# Patient Record
Sex: Female | Born: 2000 | Race: Black or African American | Hispanic: Yes | Marital: Single | Smoking: Never smoker
Health system: Southern US, Community
[De-identification: ages and names within clinical notes are randomized; demographics above are authoritative.]

## PROBLEM LIST (undated history)

## (undated) DIAGNOSIS — D649 Anemia, unspecified: Secondary | ICD-10-CM

## (undated) HISTORY — PX: NO PAST SURGERIES: SHX2092

## (undated) HISTORY — DX: Anemia, unspecified: D64.9

---

## 2019-01-06 NOTE — L&D Delivery Note (Signed)
Delivery Note At 3:20 AM a viable female was delivered via Vaginal, Spontaneous (Presentation:   Occiput Anterior).  Tight nuchal and body cord noted on delivery, delivered through. APGAR:8,9; weight 2750g. Placenta status: Spontaneous, Intact.  Cord: 3 vessels with the following complications: None.  Anesthesia: Epidural Episiotomy: None Lacerations: 1st degree, repaired Suture Repair: 3.0 vicryl Est. Blood Loss (mL): 300  Mom to postpartum.  Baby to Couplet care / Skin to Skin.  Alric Seton 10/14/2019, 4:13 AM

## 2019-02-26 ENCOUNTER — Emergency Department (HOSPITAL_COMMUNITY)

## 2019-02-26 ENCOUNTER — Other Ambulatory Visit: Payer: Self-pay

## 2019-02-26 ENCOUNTER — Emergency Department (HOSPITAL_COMMUNITY)
Admission: EM | Admit: 2019-02-26 | Discharge: 2019-02-26 | Disposition: A | Discharge status: Home / Self Care | Attending: Emergency Medicine | Admitting: Emergency Medicine

## 2019-02-26 ENCOUNTER — Encounter (HOSPITAL_COMMUNITY): Payer: Self-pay

## 2019-02-26 DIAGNOSIS — B373 Candidiasis of vulva and vagina: Secondary | ICD-10-CM | POA: Insufficient documentation

## 2019-02-26 DIAGNOSIS — B9689 Other specified bacterial agents as the cause of diseases classified elsewhere: Secondary | ICD-10-CM

## 2019-02-26 DIAGNOSIS — N760 Acute vaginitis: Secondary | ICD-10-CM | POA: Diagnosis not present

## 2019-02-26 DIAGNOSIS — O23591 Infection of other part of genital tract in pregnancy, first trimester: Secondary | ICD-10-CM | POA: Insufficient documentation

## 2019-02-26 DIAGNOSIS — Z3A01 Less than 8 weeks gestation of pregnancy: Secondary | ICD-10-CM | POA: Insufficient documentation

## 2019-02-26 DIAGNOSIS — O99891 Other specified diseases and conditions complicating pregnancy: Secondary | ICD-10-CM | POA: Diagnosis present

## 2019-02-26 DIAGNOSIS — Z3490 Encounter for supervision of normal pregnancy, unspecified, unspecified trimester: Secondary | ICD-10-CM

## 2019-02-26 DIAGNOSIS — B3731 Acute candidiasis of vulva and vagina: Secondary | ICD-10-CM

## 2019-02-26 LAB — COMPREHENSIVE METABOLIC PANEL
ALT: 16 U/L (ref 0–44)
AST: 23 U/L (ref 15–41)
Albumin: 4.5 g/dL (ref 3.5–5.0)
Alkaline Phosphatase: 50 U/L (ref 38–126)
Anion gap: 13 (ref 5–15)
BUN: 12 mg/dL (ref 6–20)
CO2: 18 mmol/L — ABNORMAL LOW (ref 22–32)
Calcium: 9.6 mg/dL (ref 8.9–10.3)
Chloride: 104 mmol/L (ref 98–111)
Creatinine, Ser: 0.75 mg/dL (ref 0.44–1.00)
GFR calc Af Amer: >60 mL/min (ref >60)
GFR calc non Af Amer: >60 mL/min (ref >60)
Glucose, Bld: 86 mg/dL (ref 70–99)
Potassium: 3.8 mmol/L (ref 3.5–5.1)
Sodium: 135 mmol/L (ref 135–145)
Total Bilirubin: 0.7 mg/dL (ref 0.3–1.2)
Total Protein: 8.1 g/dL (ref 6.5–8.1)

## 2019-02-26 LAB — URINALYSIS, ROUTINE W REFLEX MICROSCOPIC
Bacteria, UA: NONE SEEN
Bilirubin Urine: NEGATIVE
Glucose, UA: NEGATIVE mg/dL
Hgb urine dipstick: NEGATIVE
Ketones, ur: 80 mg/dL — AB
Leukocytes,Ua: NEGATIVE
Nitrite: NEGATIVE
Protein, ur: 30 mg/dL — AB
Specific Gravity, Urine: 1.029 (ref 1.005–1.030)
pH: 6 (ref 5.0–8.0)

## 2019-02-26 LAB — I-STAT BETA HCG BLOOD, ED (MC, WL, AP ONLY): I-stat hCG, quantitative: >2000 m[IU]/mL — ABNORMAL HIGH (ref <5)

## 2019-02-26 LAB — CBC
HCT: 36.5 % (ref 36.0–46.0)
Hemoglobin: 11.5 g/dL — ABNORMAL LOW (ref 12.0–15.0)
MCH: 24 pg — ABNORMAL LOW (ref 26.0–34.0)
MCHC: 31.5 g/dL (ref 30.0–36.0)
MCV: 76 fL — ABNORMAL LOW (ref 80.0–100.0)
Platelets: 383 10*3/uL (ref 150–400)
RBC: 4.8 MIL/uL (ref 3.87–5.11)
RDW: 16.3 % — ABNORMAL HIGH (ref 11.5–15.5)
WBC: 9.4 10*3/uL (ref 4.0–10.5)
nRBC: 0 % (ref 0.0–0.2)

## 2019-02-26 LAB — WET PREP, GENITAL
Sperm: NONE SEEN
Trich, Wet Prep: NONE SEEN

## 2019-02-26 LAB — HCG, QUANTITATIVE, PREGNANCY: hCG, Beta Chain, Quant, S: 60252 m[IU]/mL — ABNORMAL HIGH (ref <5)

## 2019-02-26 LAB — ABO/RH: ABO/RH(D): A POS

## 2019-02-26 LAB — LIPASE, BLOOD: Lipase: 17 U/L (ref 11–51)

## 2019-02-26 MED ORDER — METRONIDAZOLE 500 MG PO TABS: 500.0000 mg | ORAL_TABLET | Freq: Two times a day (BID) | ORAL | 0 refills | Status: AC

## 2019-02-26 MED ORDER — SODIUM CHLORIDE 0.9% FLUSH: 3.0000 mL | Freq: Once | INTRAVENOUS | Status: DC

## 2019-02-26 MED ORDER — CLOTRIMAZOLE 1 % VA CREA: 1.0000 | TOPICAL_CREAM | Freq: Every day | VAGINAL | 0 refills | Status: AC

## 2019-02-26 MED ORDER — SODIUM CHLORIDE 0.9 % IV BOLUS
1000.0000 mL | Freq: Once | INTRAVENOUS | Status: AC
  Administered 2019-02-26: 1000 mL via INTRAVENOUS

## 2019-02-26 NOTE — ED Triage Notes (Signed)
Patient c/o abdominal pain, N/V x 1 month and states she had diarrhea 2 weeks ago. Patient states she "has pain in different places of her abdomen depending on the day."

## 2019-02-26 NOTE — ED Provider Notes (Signed)
[facility]-EMERGENCY DEPT [facility] Emergency Department Provider Note MRN:  [id number]  Arrival date & time: 02/26/19     Chief Complaint   Abdominal Pain and Emesis   History of Present Illness   Cassandra Bradley is a 19 y.o. year-old female with no pertinent past medical history presenting to the ED with chief complaint of abdominal pain and emesis.  Intermittent abdominal pain for the past month, usually in the lower abdomen.  Became much worse this morning, trouble standing straight up due to the pain.  Trouble with bowel movements today, explaining that she felt too weak and sick to wipe herself.  Several episodes of vomiting today as well with nausea.  Denies chest pain or shortness of breath, no upper abdominal pain, no vaginal bleeding or discharge.  Review of Systems  A complete 10 system review of systems was obtained and all systems are negative except as noted in the HPI and PMH.   Patient's Health History   History reviewed. No pertinent past medical history.  History reviewed. No pertinent surgical history.  Family History  Problem Relation Age of Onset  . Healthy Mother   . Healthy Father     Social History   Socioeconomic History  . Marital status: Single    Spouse name: Not on file  . Number of children: Not on file  . Years of education: Not on file  . Highest education level: Not on file  Occupational History  . Not on file  Tobacco Use  . Smoking status: Never Smoker  . Smokeless tobacco: Never Used  Substance and Sexual Activity  . Alcohol use: Never  . Drug use: Never  . Sexual activity: Not on file  Other Topics Concern  . Not on file  Social History Narrative  . Not on file   Social Determinants of Health   Financial Resource Strain:   . Difficulty of Paying Living Expenses: Not on file  Food Insecurity:   . Worried About Programme researcher, broadcasting/film/video in the Last Year: Not on file  . Ran Out of Food in the Last Year: Not on file  Transportation Needs:    . Lack of Transportation (Medical): Not on file  . Lack of Transportation (Non-Medical): Not on file  Physical Activity:   . Days of Exercise per Week: Not on file  . Minutes of Exercise per Session: Not on file  Stress:   . Feeling of Stress : Not on file  Social Connections:   . Frequency of Communication with Friends and Family: Not on file  . Frequency of Social Gatherings with Friends and Family: Not on file  . Attends Religious Services: Not on file  . Active Member of Clubs or Organizations: Not on file  . Attends Banker Meetings: Not on file  . Marital Status: Not on file  Intimate Partner Violence:   . Fear of Current or Ex-Partner: Not on file  . Emotionally Abused: Not on file  . Physically Abused: Not on file  . Sexually Abused: Not on file     Physical Exam   Vitals:   02/26/19 1900 02/26/19 1901  BP: 108/63 108/63  Pulse: (!) 114 (!) 115  Resp: 18 18  Temp:    SpO2: 100% 100%    CONSTITUTIONAL: Well-appearing, NAD NEURO:  Alert and oriented x 3, no focal deficits EYES:  eyes equal and reactive ENT/NECK:  no LAD, no JVD CARDIO: Tachycardic rate, well-perfused, normal S1 and S2 PULM:  CTAB  no wheezing or rhonchi GI/GU:  normal bowel sounds, non-distended, mild right and left lower abdominal tenderness MSK/SPINE:  No gross deformities, no edema SKIN:  no rash, atraumatic PSYCH:  Appropriate speech and behavior  *Additional and/or pertinent findings included in MDM below  Diagnostic and Interventional Summary    EKG Interpretation  Date/Time:    Ventricular Rate:    PR Interval:    QRS Duration:   QT Interval:    QTC Calculation:   R Axis:     Text Interpretation:        Cardiac Monitoring Interpretation:  Labs Reviewed  WET PREP, GENITAL - Abnormal; Notable for the following components:      Result Value   Yeast Wet Prep HPF POC PRESENT (*)    Clue Cells Wet Prep HPF POC PRESENT (*)    WBC, Wet Prep HPF POC MANY (*)    All  other components within normal limits  COMPREHENSIVE METABOLIC PANEL - Abnormal; Notable for the following components:   CO2 18 (*)    All other components within normal limits  CBC - Abnormal; Notable for the following components:   Hemoglobin 11.5 (*)    MCV 76.0 (*)    MCH 24.0 (*)    RDW 16.3 (*)    All other components within normal limits  URINALYSIS, ROUTINE W REFLEX MICROSCOPIC - Abnormal; Notable for the following components:   Ketones, ur 80 (*)    Protein, ur 30 (*)    All other components within normal limits  HCG, QUANTITATIVE, PREGNANCY - Abnormal; Notable for the following components:   hCG, Beta Chain, Quant, S 60,252 (*)    All other components within normal limits  I-STAT BETA HCG BLOOD, ED ([facility], [facility], [facility] ONLY) - Abnormal; Notable for the following components:   I-stat hCG, quantitative >2,000.0 (*)    All other components within normal limits  LIPASE, BLOOD  ABO/RH  GC/CHLAMYDIA PROBE AMP ([place]) NOT AT [facility]    US OB Comp < 14 Wks  Final Result    US OB Transvaginal  Final Result      Medications  sodium chloride flush (NS) 0.9 % injection 3 mL (0 mLs Intravenous Hold 02/26/19 1909)  sodium chloride 0.9 % bolus 1,000 mL (0 mLs Intravenous Stopped 02/26/19 1908)     Procedures  /  Critical Care Procedures  ED Course and Medical Decision Making  I have reviewed the triage vital signs, the nursing notes, and pertinent available records from the EMR.  Pertinent labs & imaging results that were available during my care of the patient were reviewed by me and considered in my medical decision making (see below for details).     Question of appendicitis versus genitourinary etiology such as UTI or PID, also considering IBS, IBD, given the swift change in severity today will obtain CT imaging to exclude surgical process.  4 PM update: Patient's i-STAT hCG has come back positive, greater than 2000.  This news discussed with patient, unplanned pregnancy.  CT  abdomen canceled, will obtain ultrasound imaging to confirm intrauterine pregnancy and rule out ectopic.  Pelvic exam is largely unremarkable, normal-appearing external genitalia, no significant adnexal tenderness, normal appearing cervix.  Moderate amount of white mucus in the vaginal vault, to be sent to lab for testing.  7:26 PM update: Testing reveals intrauterine pregnancy, estimated 6 weeks and 3 days.  Swabs reveal candidal yeast infection and BV.  I suspect that the pregnancy and these infections are the  etiology of patient's pain.  Given these findings and her continued reassuring abdominal exam I have low concern for surgical process.  Appropriate for discharge with appropriate antimicrobials and OB follow-up.  Elmer Sow. Pilar Plate, MD [facility] Emergency Medicine [facility] [email or web address]  Final Clinical Impressions(s) / ED Diagnoses     ICD-10-CM   1. Pregnancy at early stage  Z34.90   2. Yeast infection of the vagina  B37.3   3. Bacterial vaginosis  N76.0    B96.89     ED Discharge Orders         Ordered    clotrimazole (GYNE-LOTRIMIN) 1 % vaginal cream  Daily at bedtime     02/26/19 1925    metroNIDAZOLE (FLAGYL) 500 MG tablet  2 times daily     02/26/19 1925           Discharge Instructions Discussed with and Provided to Patient:     Discharge Instructions     You were evaluated in the Emergency Department and after careful evaluation, we did not find any emergent condition requiring admission or further testing in the hospital.  Your exam/testing today was overall reassuring.  We have discovered that you are pregnant, estimated 6 weeks and 3 days along.  Your testing was positive for a yeast infection as well as bacterial vaginosis.  Please take the medications provided as directed and follow-up with the women's center for further management options.  Please return to the Emergency Department if you experience any worsening of your condition.   We encourage you to follow up with a primary care provider.  Thank you for allowing [place] to be a part of your care.        Sabas Sous, MD 02/26/19 Kristopher Oppenheim

## 2019-02-26 NOTE — Discharge Instructions (Addendum)
You were evaluated in the Emergency Department and after careful evaluation, we did not find any emergent condition requiring admission or further testing in the hospital.  Your exam/testing today was overall reassuring.  We have discovered that you are pregnant, estimated 6 weeks and 3 days along.  Your testing was positive for a yeast infection as well as bacterial vaginosis.  Please take the medications provided as directed and follow-up with the women's center for further management options.  Please return to the Emergency Department if you experience any worsening of your condition.  We encourage you to follow up with a primary care provider.  Thank you for allowing [place] to be a part of your care.

## 2019-02-28 LAB — GC/CHLAMYDIA PROBE AMP (~~LOC~~) NOT AT ARMC
Chlamydia: NEGATIVE
Neisseria Gonorrhea: NEGATIVE

## 2019-03-29 ENCOUNTER — Ambulatory Visit: Admitting: *Deleted

## 2019-03-29 DIAGNOSIS — Z3401 Encounter for supervision of normal first pregnancy, first trimester: Secondary | ICD-10-CM | POA: Insufficient documentation

## 2019-03-29 DIAGNOSIS — O219 Vomiting of pregnancy, unspecified: Secondary | ICD-10-CM

## 2019-03-29 MED ORDER — PROMETHAZINE HCL 25 MG PO TABS: 25.0000 mg | ORAL_TABLET | Freq: Four times a day (QID) | ORAL | 0 refills | Status: DC | PRN

## 2019-03-29 MED ORDER — BLOOD PRESSURE MONITOR KIT: 1.0000 | PACK | Freq: Once | 0 refills | Status: AC

## 2019-03-29 NOTE — Progress Notes (Signed)
Patient ID: Cassandra Bradley, female   DOB: June 14, 2000, 19 y.o.   MRN: [id number] Patient seen and assessed by nursing staff during this encounter. I have reviewed the chart and agree with the documentation and plan. I have also made any necessary editorial changes.  Scheryl Darter, MD 03/29/2019 3:32 PM

## 2019-03-29 NOTE — Progress Notes (Signed)
PRENATAL INTAKE SUMMARY  Ms. Klus presents today New OB Nurse Interview.  OB History    Gravida  1   Para      Term      Preterm      AB      Living        SAB      TAB      Ectopic      Multiple      Live Births             I have reviewed the patient's medical, obstetrical, social, and family histories, medications, and available lab results.  SUBJECTIVE She complaints of N&V  OBJECTIVE New OB Intake (New OB)  GENERAL APPEARANCE: sounds alert via televisit  ASSESSMENT Normal pregnancy  PLAN Prenatal care at [facility] ordered today Phenergan sent today BP cuff ordered

## 2019-04-06 ENCOUNTER — Ambulatory Visit (INDEPENDENT_AMBULATORY_CARE_PROVIDER_SITE_OTHER): Admitting: Obstetrics and Gynecology

## 2019-04-06 ENCOUNTER — Encounter: Payer: Self-pay | Admitting: Obstetrics and Gynecology

## 2019-04-06 ENCOUNTER — Other Ambulatory Visit: Payer: Self-pay

## 2019-04-06 ENCOUNTER — Other Ambulatory Visit (HOSPITAL_COMMUNITY)
Admission: RE | Admit: 2019-04-06 | Discharge: 2019-04-06 | Disposition: A | Discharge status: Home / Self Care | Source: Ambulatory Visit | Attending: Obstetrics and Gynecology | Admitting: Obstetrics and Gynecology

## 2019-04-06 VITALS — BP 113/80 | HR 114 | Wt 110.6 lb

## 2019-04-06 DIAGNOSIS — Z3401 Encounter for supervision of normal first pregnancy, first trimester: Secondary | ICD-10-CM | POA: Insufficient documentation

## 2019-04-06 MED ORDER — VITAFOL GUMMIES 3.33-0.333-34.8 MG PO CHEW: 2.0000 | CHEWABLE_TABLET | Freq: Every day | ORAL | 6 refills | Status: AC

## 2019-04-06 MED ORDER — PRENATAL GUMMIES/DHA & FA 0.4-32.5 MG PO CHEW: 3.0000 | CHEWABLE_TABLET | Freq: Every day | ORAL | 6 refills | Status: AC

## 2019-04-06 MED ORDER — DOXYLAMINE-PYRIDOXINE 10-10 MG PO TBEC: 2.0000 | DELAYED_RELEASE_TABLET | Freq: Every day | ORAL | 5 refills | Status: DC

## 2019-04-06 NOTE — Progress Notes (Signed)
  Subjective:    Cassandra Bradley is a G1P0 [redacted]w[redacted]d being seen today for her first obstetrical visit.  Her obstetrical history is significant for first pregnancy. Patient does intend to breast feed. Pregnancy history fully reviewed.  Patient reports nausea and vomiting.  Vitals:   04/06/19 0910  BP: 113/80  Pulse: (!) 114  Weight: 110 lb 9.6 oz (50.2 kg)    HISTORY: OB History  Gravida Para Term Preterm AB Living  1            SAB TAB Ectopic Multiple Live Births               # Outcome Date GA Lbr Len/2nd Weight Sex Delivery Anes PTL Lv  1 Current            History reviewed. No pertinent past medical history. History reviewed. No pertinent surgical history. Family History  Problem Relation Age of Onset  . Healthy Mother   . Healthy Father   . Hypertension Paternal Grandmother   . Hypertension Paternal Grandfather   . Lupus Paternal Grandfather   . Sickle cell trait Paternal Grandfather   . Diabetes Maternal Grandfather      Exam    Uterus:     Pelvic Exam:    Perineum: Normal Perineum   Vulva: normal   Vagina:  normal mucosa, normal discharge   pH:    Cervix: nulliparous appearance and cervix is closed and long   Adnexa: no mass, fullness, tenderness   Bony Pelvis: gynecoid  System: Breast:  normal appearance, no masses or tenderness   Skin: normal coloration and turgor, no rashes    Neurologic: oriented, no focal deficits   Extremities: normal strength, tone, and muscle mass   HEENT extra ocular movement intact   Mouth/Teeth mucous membranes moist, pharynx normal without lesions and dental hygiene good   Neck supple and no masses   Cardiovascular: regular rate and rhythm   Respiratory:  appears well, vitals normal, no respiratory distress, acyanotic, normal RR, chest clear, no wheezing, crepitations, rhonchi, normal symmetric air entry   Abdomen: soft, non-tender; bowel sounds normal; no masses,  no organomegaly   Urinary:       Assessment:    Pregnancy:  G1P0 Patient Active Problem List   Diagnosis Date Noted  . Encounter for supervision of normal first pregnancy in first trimester 03/29/2019        Plan:     Initial labs drawn. Prenatal vitamins. Problem list reviewed and updated. Genetic Screening discussed : panorama ordered.  Ultrasound discussed; fetal survey: ordered. Rx diclegis provided  Follow up in 4 weeks. 50% of 30 min visit spent on counseling and coordination of care.     Mairin Lindsley 04/06/2019

## 2019-04-06 NOTE — Progress Notes (Signed)
NOB in office, 1st pregnancy, complains of some back pain. Pt desires genetic screening, advised of additional costs, pt agreed. Pt states that she picked up BP cuff.

## 2019-04-06 NOTE — Patient Instructions (Signed)
 First Trimester of Pregnancy The first trimester of pregnancy is from week 1 until the end of week 13 (months 1 through 3). A week after a sperm fertilizes an egg, the egg will implant on the wall of the uterus. This embryo will begin to develop into a baby. Genes from you and your partner will form the baby. The female genes will determine whether the baby will be a boy or a girl. At 6-8 weeks, the eyes and face will be formed, and the heartbeat can be seen on ultrasound. At the end of 12 weeks, all the baby's organs will be formed. Now that you are pregnant, you will want to do everything you can to have a healthy baby. Two of the most important things are to get good prenatal care and to follow your health care provider's instructions. Prenatal care is all the medical care you receive before the baby's birth. This care will help prevent, find, and treat any problems during the pregnancy and childbirth. Body changes during your first trimester Your body goes through many changes during pregnancy. The changes vary from woman to woman.  You may gain or lose a couple of pounds at first.  You may feel sick to your stomach (nauseous) and you may throw up (vomit). If the vomiting is uncontrollable, call your health care provider.  You may tire easily.  You may develop headaches that can be relieved by medicines. All medicines should be approved by your health care provider.  You may urinate more often. Painful urination may mean you have a bladder infection.  You may develop heartburn as a result of your pregnancy.  You may develop constipation because certain hormones are causing the muscles that push stool through your intestines to slow down.  You may develop hemorrhoids or swollen veins (varicose veins).  Your breasts may begin to grow larger and become tender. Your nipples may stick out more, and the tissue that surrounds them (areola) may become darker.  Your gums may bleed and may be  sensitive to brushing and flossing.  Dark spots or blotches (chloasma, mask of pregnancy) may develop on your face. This will likely fade after the baby is born.  Your menstrual periods will stop.  You may have a loss of appetite.  You may develop cravings for certain kinds of food.  You may have changes in your emotions from day to day, such as being excited to be pregnant or being concerned that something may go wrong with the pregnancy and baby.  You may have more vivid and strange dreams.  You may have changes in your hair. These can include thickening of your hair, rapid growth, and changes in texture. Some women also have hair loss during or after pregnancy, or hair that feels dry or thin. Your hair will most likely return to normal after your baby is born. What to expect at prenatal visits During a routine prenatal visit:  You will be weighed to make sure you and the baby are growing normally.  Your blood pressure will be taken.  Your abdomen will be measured to track your baby's growth.  The fetal heartbeat will be listened to between weeks 10 and 14 of your pregnancy.  Test results from any previous visits will be discussed. Your health care provider may ask you:  How you are feeling.  If you are feeling the baby move.  If you have had any abnormal symptoms, such as leaking fluid, bleeding, severe headaches, or   abdominal cramping.  If you are using any tobacco products, including cigarettes, chewing tobacco, and electronic cigarettes.  If you have any questions. Other tests that may be performed during your first trimester include:  Blood tests to find your blood type and to check for the presence of any previous infections. The tests will also be used to check for low iron levels (anemia) and protein on red blood cells (Rh antibodies). Depending on your risk factors, or if you previously had diabetes during pregnancy, you may have tests to check for high blood sugar  that affects pregnant women (gestational diabetes).  Urine tests to check for infections, diabetes, or protein in the urine.  An ultrasound to confirm the proper growth and development of the baby.  Fetal screens for spinal cord problems (spina bifida) and Down syndrome.  HIV (human immunodeficiency virus) testing. Routine prenatal testing includes screening for HIV, unless you choose not to have this test.  You may need other tests to make sure you and the baby are doing well. Follow these instructions at home: Medicines  Follow your health care provider's instructions regarding medicine use. Specific medicines may be either safe or unsafe to take during pregnancy.  Take a prenatal vitamin that contains at least 600 micrograms (mcg) of folic acid.  If you develop constipation, try taking a stool softener if your health care provider approves. Eating and drinking   Eat a balanced diet that includes fresh fruits and vegetables, whole grains, good sources of protein such as meat, eggs, or tofu, and low-fat dairy. Your health care provider will help you determine the amount of weight gain that is right for you.  Avoid raw meat and uncooked cheese. These carry germs that can cause birth defects in the baby.  Eating four or five small meals rather than three large meals a day may help relieve nausea and vomiting. If you start to feel nauseous, eating a few soda crackers can be helpful. Drinking liquids between meals, instead of during meals, also seems to help ease nausea and vomiting.  Limit foods that are high in fat and processed sugars, such as fried and sweet foods.  To prevent constipation: ? Eat foods that are high in fiber, such as fresh fruits and vegetables, whole grains, and beans. ? Drink enough fluid to keep your urine clear or pale yellow. Activity  Exercise only as directed by your health care provider. Most women can continue their usual exercise routine during  pregnancy. Try to exercise for 30 minutes at least 5 days a week. Exercising will help you: ? Control your weight. ? Stay in shape. ? Be prepared for labor and delivery.  Experiencing pain or cramping in the lower abdomen or lower back is a good sign that you should stop exercising. Check with your health care provider before continuing with normal exercises.  Try to avoid standing for long periods of time. Move your legs often if you must stand in one place for a long time.  Avoid heavy lifting.  Wear low-heeled shoes and practice good posture.  You may continue to have sex unless your health care provider tells you not to. Relieving pain and discomfort  Wear a good support bra to relieve breast tenderness.  Take warm sitz baths to soothe any pain or discomfort caused by hemorrhoids. Use hemorrhoid cream if your health care provider approves.  Rest with your legs elevated if you have leg cramps or low back pain.  If you develop varicose veins   in your legs, wear support hose. Elevate your feet for 15 minutes, 3-4 times a day. Limit salt in your diet. Prenatal care  Schedule your prenatal visits by the twelfth week of pregnancy. They are usually scheduled monthly at first, then more often in the last 2 months before delivery.  Write down your questions. Take them to your prenatal visits.  Keep all your prenatal visits as told by your health care provider. This is important. Safety  Wear your seat belt at all times when driving.  Make a list of emergency phone numbers, including numbers for family, friends, the hospital, and police and fire departments. General instructions  Ask your health care provider for a referral to a local prenatal education class. Begin classes no later than the beginning of month 6 of your pregnancy.  Ask for help if you have counseling or nutritional needs during pregnancy. Your health care provider can offer advice or refer you to specialists for help  with various needs.  Do not use hot tubs, steam rooms, or saunas.  Do not douche or use tampons or scented sanitary pads.  Do not cross your legs for long periods of time.  Avoid cat litter boxes and soil used by cats. These carry germs that can cause birth defects in the baby and possibly loss of the fetus by miscarriage or stillbirth.  Avoid all smoking, herbs, alcohol, and medicines not prescribed by your health care provider. Chemicals in these products affect the formation and growth of the baby.  Do not use any products that contain nicotine or tobacco, such as cigarettes and e-cigarettes. If you need help quitting, ask your health care provider. You may receive counseling support and other resources to help you quit.  Schedule a dentist appointment. At home, brush your teeth with a soft toothbrush and be gentle when you floss. Contact a health care provider if:  You have dizziness.  You have mild pelvic cramps, pelvic pressure, or nagging pain in the abdominal area.  You have persistent nausea, vomiting, or diarrhea.  You have a bad smelling vaginal discharge.  You have pain when you urinate.  You notice increased swelling in your face, hands, legs, or ankles.  You are exposed to fifth disease or chickenpox.  You are exposed to German measles (rubella) and have never had it. Get help right away if:  You have a fever.  You are leaking fluid from your vagina.  You have spotting or bleeding from your vagina.  You have severe abdominal cramping or pain.  You have rapid weight gain or loss.  You vomit blood or material that looks like coffee grounds.  You develop a severe headache.  You have shortness of breath.  You have any kind of trauma, such as from a fall or a car accident. Summary  The first trimester of pregnancy is from week 1 until the end of week 13 (months 1 through 3).  Your body goes through many changes during pregnancy. The changes vary from  woman to woman.  You will have routine prenatal visits. During those visits, your health care provider will examine you, discuss any test results you may have, and talk with you about how you are feeling. This information is not intended to replace advice given to you by your health care provider. Make sure you discuss any questions you have with your health care provider. Document Revised: 12/04/2016 Document Reviewed: 12/04/2015 [employer] Patient Education  2020 [employer]   Second Trimester of   Pregnancy The second trimester is from week 14 through week 27 (months 4 through 6). The second trimester is often a time when you feel your best. Your body has adjusted to being pregnant, and you begin to feel better physically. Usually, morning sickness has lessened or quit completely, you may have more energy, and you may have an increase in appetite. The second trimester is also a time when the fetus is growing rapidly. At the end of the sixth month, the fetus is about 9 inches long and weighs about 1 pounds. You will likely begin to feel the baby move (quickening) between 16 and 20 weeks of pregnancy. Body changes during your second trimester Your body continues to go through many changes during your second trimester. The changes vary from woman to woman.  Your weight will continue to increase. You will notice your lower abdomen bulging out.  You may begin to get stretch marks on your hips, abdomen, and breasts.  You may develop headaches that can be relieved by medicines. The medicines should be approved by your health care provider.  You may urinate more often because the fetus is pressing on your bladder.  You may develop or continue to have heartburn as a result of your pregnancy.  You may develop constipation because certain hormones are causing the muscles that push waste through your intestines to slow down.  You may develop hemorrhoids or swollen, bulging veins (varicose  veins).  You may have back pain. This is caused by: ? Weight gain. ? Pregnancy hormones that are relaxing the joints in your pelvis. ? A shift in weight and the muscles that support your balance.  Your breasts will continue to grow and they will continue to become tender.  Your gums may bleed and may be sensitive to brushing and flossing.  Dark spots or blotches (chloasma, mask of pregnancy) may develop on your face. This will likely fade after the baby is born.  A dark line from your belly button to the pubic area (linea nigra) may appear. This will likely fade after the baby is born.  You may have changes in your hair. These can include thickening of your hair, rapid growth, and changes in texture. Some women also have hair loss during or after pregnancy, or hair that feels dry or thin. Your hair will most likely return to normal after your baby is born. What to expect at prenatal visits During a routine prenatal visit:  You will be weighed to make sure you and the fetus are growing normally.  Your blood pressure will be taken.  Your abdomen will be measured to track your baby's growth.  The fetal heartbeat will be listened to.  Any test results from the previous visit will be discussed. Your health care provider may ask you:  How you are feeling.  If you are feeling the baby move.  If you have had any abnormal symptoms, such as leaking fluid, bleeding, severe headaches, or abdominal cramping.  If you are using any tobacco products, including cigarettes, chewing tobacco, and electronic cigarettes.  If you have any questions. Other tests that may be performed during your second trimester include:  Blood tests that check for: ? Low iron levels (anemia). ? High blood sugar that affects pregnant women (gestational diabetes) between 24 and 28 weeks. ? Rh antibodies. This is to check for a protein on red blood cells (Rh factor).  Urine tests to check for infections,  diabetes, or protein in the urine.    An ultrasound to confirm the proper growth and development of the baby.  An amniocentesis to check for possible genetic problems.  Fetal screens for spina bifida and Down syndrome.  HIV (human immunodeficiency virus) testing. Routine prenatal testing includes screening for HIV, unless you choose not to have this test. Follow these instructions at home: Medicines  Follow your health care provider's instructions regarding medicine use. Specific medicines may be either safe or unsafe to take during pregnancy.  Take a prenatal vitamin that contains at least 600 micrograms (mcg) of folic acid.  If you develop constipation, try taking a stool softener if your health care provider approves. Eating and drinking   Eat a balanced diet that includes fresh fruits and vegetables, whole grains, good sources of protein such as meat, eggs, or tofu, and low-fat dairy. Your health care provider will help you determine the amount of weight gain that is right for you.  Avoid raw meat and uncooked cheese. These carry germs that can cause birth defects in the baby.  If you have low calcium intake from food, talk to your health care provider about whether you should take a daily calcium supplement.  Limit foods that are high in fat and processed sugars, such as fried and sweet foods.  To prevent constipation: ? Drink enough fluid to keep your urine clear or pale yellow. ? Eat foods that are high in fiber, such as fresh fruits and vegetables, whole grains, and beans. Activity  Exercise only as directed by your health care provider. Most women can continue their usual exercise routine during pregnancy. Try to exercise for 30 minutes at least 5 days a week. Stop exercising if you experience uterine contractions.  Avoid heavy lifting, wear low heel shoes, and practice good posture.  A sexual relationship may be continued unless your health care provider directs you  otherwise. Relieving pain and discomfort  Wear a good support bra to prevent discomfort from breast tenderness.  Take warm sitz baths to soothe any pain or discomfort caused by hemorrhoids. Use hemorrhoid cream if your health care provider approves.  Rest with your legs elevated if you have leg cramps or low back pain.  If you develop varicose veins, wear support hose. Elevate your feet for 15 minutes, 3-4 times a day. Limit salt in your diet. Prenatal Care  Write down your questions. Take them to your prenatal visits.  Keep all your prenatal visits as told by your health care provider. This is important. Safety  Wear your seat belt at all times when driving.  Make a list of emergency phone numbers, including numbers for family, friends, the hospital, and police and fire departments. General instructions  Ask your health care provider for a referral to a local prenatal education class. Begin classes no later than the beginning of month 6 of your pregnancy.  Ask for help if you have counseling or nutritional needs during pregnancy. Your health care provider can offer advice or refer you to specialists for help with various needs.  Do not use hot tubs, steam rooms, or saunas.  Do not douche or use tampons or scented sanitary pads.  Do not cross your legs for long periods of time.  Avoid cat litter boxes and soil used by cats. These carry germs that can cause birth defects in the baby and possibly loss of the fetus by miscarriage or stillbirth.  Avoid all smoking, herbs, alcohol, and unprescribed drugs. Chemicals in these products can affect the formation and growth of   the baby.  Do not use any products that contain nicotine or tobacco, such as cigarettes and e-cigarettes. If you need help quitting, ask your health care provider.  Visit your dentist if you have not gone yet during your pregnancy. Use a soft toothbrush to brush your teeth and be gentle when you floss. Contact a  health care provider if:  You have dizziness.  You have mild pelvic cramps, pelvic pressure, or nagging pain in the abdominal area.  You have persistent nausea, vomiting, or diarrhea.  You have a bad smelling vaginal discharge.  You have pain when you urinate. Get help right away if:  You have a fever.  You are leaking fluid from your vagina.  You have spotting or bleeding from your vagina.  You have severe abdominal cramping or pain.  You have rapid weight gain or weight loss.  You have shortness of breath with chest pain.  You notice sudden or extreme swelling of your face, hands, ankles, feet, or legs.  You have not felt your baby move in over an hour.  You have severe headaches that do not go away when you take medicine.  You have vision changes. Summary  The second trimester is from week 14 through week 27 (months 4 through 6). It is also a time when the fetus is growing rapidly.  Your body goes through many changes during pregnancy. The changes vary from woman to woman.  Avoid all smoking, herbs, alcohol, and unprescribed drugs. These chemicals affect the formation and growth your baby.  Do not use any tobacco products, such as cigarettes, chewing tobacco, and e-cigarettes. If you need help quitting, ask your health care provider.  Contact your health care provider if you have any questions. Keep all prenatal visits as told by your health care provider. This is important. This information is not intended to replace advice given to you by your health care provider. Make sure you discuss any questions you have with your health care provider. Document Revised: 04/15/2018 Document Reviewed: 01/28/2016 Else[employer] Patient Education  2020 [employer]   Contraception Choices Contraception, also called birth control, refers to methods or devices that prevent pregnancy. Hormonal methods Contraceptive implant  A contraceptive implant is a thin, plastic tube that  contains a hormone. It is inserted into the upper part of the arm. It can remain in place for up to 3 years. Progestin-only injections Progestin-only injections are injections of progestin, a synthetic form of the hormone progesterone. They are given every 3 months by a health care provider. Birth control pills  Birth control pills are pills that contain hormones that prevent pregnancy. They must be taken once a day, preferably at the same time each day. Birth control patch  The birth control patch contains hormones that prevent pregnancy. It is placed on the skin and must be changed once a week for three weeks and removed on the fourth week. A prescription is needed to use this method of contraception. Vaginal ring  A vaginal ring contains hormones that prevent pregnancy. It is placed in the vagina for three weeks and removed on the fourth week. After that, the process is repeated with a new ring. A prescription is needed to use this method of contraception. Emergency contraceptive Emergency contraceptives prevent pregnancy after unprotected sex. They come in pill form and can be taken up to 5 days after sex. They work best the sooner they are taken after having sex. Most emergency contraceptives are available without a prescription. This   method should not be used as your only form of birth control. Barrier methods Female condom  A female condom is a thin sheath that is worn over the penis during sex. Condoms keep sperm from going inside a woman's body. They can be used with a spermicide to increase their effectiveness. They should be disposed after a single use. Female condom  A female condom is a soft, loose-fitting sheath that is put into the vagina before sex. The condom keeps sperm from going inside a woman's body. They should be disposed after a single use. Diaphragm  A diaphragm is a soft, dome-shaped barrier. It is inserted into the vagina before sex, along with a spermicide. The  diaphragm blocks sperm from entering the uterus, and the spermicide kills sperm. A diaphragm should be left in the vagina for 6-8 hours after sex and removed within 24 hours. A diaphragm is prescribed and fitted by a health care provider. A diaphragm should be replaced every 1-2 years, after giving birth, after gaining more than 15 lb (6.8 kg), and after pelvic surgery. Cervical cap  A cervical cap is a round, soft latex or plastic cup that fits over the cervix. It is inserted into the vagina before sex, along with spermicide. It blocks sperm from entering the uterus. The cap should be left in place for 6-8 hours after sex and removed within 48 hours. A cervical cap must be prescribed and fitted by a health care provider. It should be replaced every 2 years. Sponge  A sponge is a soft, circular piece of polyurethane foam with spermicide on it. The sponge helps block sperm from entering the uterus, and the spermicide kills sperm. To use it, you make it wet and then insert it into the vagina. It should be inserted before sex, left in for at least 6 hours after sex, and removed and thrown away within 30 hours. Spermicides Spermicides are chemicals that kill or block sperm from entering the cervix and uterus. They can come as a cream, jelly, suppository, foam, or tablet. A spermicide should be inserted into the vagina with an applicator at least 10-15 minutes before sex to allow time for it to work. The process must be repeated every time you have sex. Spermicides do not require a prescription. Intrauterine contraception Intrauterine device (IUD) An IUD is a T-shaped device that is put in a woman's uterus. There are two types:  Hormone IUD.This type contains progestin, a synthetic form of the hormone progesterone. This type can stay in place for 3-5 years.  Copper IUD.This type is wrapped in copper wire. It can stay in place for 10 years.  Permanent methods of contraception Female tubal ligation In  this method, a woman's fallopian tubes are sealed, tied, or blocked during surgery to prevent eggs from traveling to the uterus. Hysteroscopic sterilization In this method, a small, flexible insert is placed into each fallopian tube. The inserts cause scar tissue to form in the fallopian tubes and block them, so sperm cannot reach an egg. The procedure takes about 3 months to be effective. Another form of birth control must be used during those 3 months. Female sterilization This is a procedure to tie off the tubes that carry sperm (vasectomy). After the procedure, the man can still ejaculate fluid (semen). Natural planning methods Natural family planning In this method, a couple does not have sex on days when the woman could become pregnant. Calendar method This means keeping track of the length of each menstrual cycle,   identifying the days when pregnancy can happen, and not having sex on those days. Ovulation method In this method, a couple avoids sex during ovulation. Symptothermal method This method involves not having sex during ovulation. The woman typically checks for ovulation by watching changes in her temperature and in the consistency of cervical mucus. Post-ovulation method In this method, a couple waits to have sex until after ovulation. Summary  Contraception, also called birth control, means methods or devices that prevent pregnancy.  Hormonal methods of contraception include implants, injections, pills, patches, vaginal rings, and emergency contraceptives.  Barrier methods of contraception can include female condoms, female condoms, diaphragms, cervical caps, sponges, and spermicides.  There are two types of IUDs (intrauterine devices). An IUD can be put in a woman's uterus to prevent pregnancy for 3-5 years.  Permanent sterilization can be done through a procedure for males, females, or both.  Natural family planning methods involve not having sex on days when the woman could  become pregnant. This information is not intended to replace advice given to you by your health care provider. Make sure you discuss any questions you have with your health care provider. Document Revised: 12/24/2016 Document Reviewed: 01/25/2016 Elsevier Patient Education  2020 [employer] Inc.   Breastfeeding  Choosing to breastfeed is one of the best decisions you can make for yourself and your baby. A change in hormones during pregnancy causes your breasts to make breast milk in your milk-producing glands. Hormones prevent breast milk from being released before your baby is born. They also prompt milk flow after birth. Once breastfeeding has begun, thoughts of your baby, as well as his or her sucking or crying, can stimulate the release of milk from your milk-producing glands. Benefits of breastfeeding Research shows that breastfeeding offers many health benefits for infants and mothers. It also offers a cost-free and convenient way to feed your baby. For your baby  Your first milk (colostrum) helps your baby's digestive system to function better.  Special cells in your milk (antibodies) help your baby to fight off infections.  Breastfed babies are less likely to develop asthma, allergies, obesity, or type 2 diabetes. They are also at lower risk for sudden infant death syndrome (SIDS).  Nutrients in breast milk are better able to meet your baby's needs compared to infant formula.  Breast milk improves your baby's brain development. For you  Breastfeeding helps to create a very special bond between you and your baby.  Breastfeeding is convenient. Breast milk costs nothing and is always available at the correct temperature.  Breastfeeding helps to burn calories. It helps you to lose the weight that you gained during pregnancy.  Breastfeeding makes your uterus return faster to its size before pregnancy. It also slows bleeding (lochia) after you give birth.  Breastfeeding helps to lower  your risk of developing type 2 diabetes, osteoporosis, rheumatoid arthritis, cardiovascular disease, and breast, ovarian, uterine, and endometrial cancer later in life. Breastfeeding basics Starting breastfeeding  Find a comfortable place to sit or lie down, with your neck and back well-supported.  Place a pillow or a rolled-up blanket under your baby to bring him or her to the level of your breast (if you are seated). Nursing pillows are specially designed to help support your arms and your baby while you breastfeed.  Make sure that your baby's tummy (abdomen) is facing your abdomen.  Gently massage your breast. With your fingertips, massage from the outer edges of your breast inward toward the nipple. This encourages   milk flow. If your milk flows slowly, you may need to continue this action during the feeding.  Support your breast with 4 fingers underneath and your thumb above your nipple (make the letter "C" with your hand). Make sure your fingers are well away from your nipple and your baby's mouth.  Stroke your baby's lips gently with your finger or nipple.  When your baby's mouth is open wide enough, quickly bring your baby to your breast, placing your entire nipple and as much of the areola as possible into your baby's mouth. The areola is the colored area around your nipple. ? More areola should be visible above your baby's upper lip than below the lower lip. ? Your baby's lips should be opened and extended outward (flanged) to ensure an adequate, comfortable latch. ? Your baby's tongue should be between his or her lower gum and your breast.  Make sure that your baby's mouth is correctly positioned around your nipple (latched). Your baby's lips should create a seal on your breast and be turned out (everted).  It is common for your baby to suck about 2-3 minutes in order to start the flow of breast milk. Latching Teaching your baby how to latch onto your breast properly is very  important. An improper latch can cause nipple pain, decreased milk supply, and poor weight gain in your baby. Also, if your baby is not latched onto your nipple properly, he or she may swallow some air during feeding. This can make your baby fussy. Burping your baby when you switch breasts during the feeding can help to get rid of the air. However, teaching your baby to latch on properly is still the best way to prevent fussiness from swallowing air while breastfeeding. Signs that your baby has successfully latched onto your nipple  Silent tugging or silent sucking, without causing you pain. Infant's lips should be extended outward (flanged).  Swallowing heard between every 3-4 sucks once your milk has started to flow (after your let-down milk reflex occurs).  Muscle movement above and in front of his or her ears while sucking. Signs that your baby has not successfully latched onto your nipple  Sucking sounds or smacking sounds from your baby while breastfeeding.  Nipple pain. If you think your baby has not latched on correctly, slip your finger into the corner of your baby's mouth to break the suction and place it between your baby's gums. Attempt to start breastfeeding again. Signs of successful breastfeeding Signs from your baby  Your baby will gradually decrease the number of sucks or will completely stop sucking.  Your baby will fall asleep.  Your baby's body will relax.  Your baby will retain a small amount of milk in his or her mouth.  Your baby will let go of your breast by himself or herself. Signs from you  Breasts that have increased in firmness, weight, and size 1-3 hours after feeding.  Breasts that are softer immediately after breastfeeding.  Increased milk volume, as well as a change in milk consistency and color by the fifth day of breastfeeding.  Nipples that are not sore, cracked, or bleeding. Signs that your baby is getting enough milk  Wetting at least 1-2  diapers during the first 24 hours after birth.  Wetting at least 5-6 diapers every 24 hours for the first week after birth. The urine should be clear or pale yellow by the age of 5 days.  Wetting 6-8 diapers every 24 hours as your baby continues   to grow and develop.  At least 3 stools in a 24-hour period by the age of 5 days. The stool should be soft and yellow.  At least 3 stools in a 24-hour period by the age of 7 days. The stool should be seedy and yellow.  No loss of weight greater than 10% of birth weight during the first 3 days of life.  Average weight gain of 4-7 oz (113-198 g) per week after the age of 4 days.  Consistent daily weight gain by the age of 5 days, without weight loss after the age of 2 weeks. After a feeding, your baby may spit up a small amount of milk. This is normal. Breastfeeding frequency and duration Frequent feeding will help you make more milk and can prevent sore nipples and extremely full breasts (breast engorgement). Breastfeed when you feel the need to reduce the fullness of your breasts or when your baby shows signs of hunger. This is called "breastfeeding on demand." Signs that your baby is hungry include:  Increased alertness, activity, or restlessness.  Movement of the head from side to side.  Opening of the mouth when the corner of the mouth or cheek is stroked (rooting).  Increased sucking sounds, smacking lips, cooing, sighing, or squeaking.  Hand-to-mouth movements and sucking on fingers or hands.  Fussing or crying. Avoid introducing a pacifier to your baby in the first 4-6 weeks after your baby is born. After this time, you may choose to use a pacifier. Research has shown that pacifier use during the first year of a baby's life decreases the risk of sudden infant death syndrome (SIDS). Allow your baby to feed on each breast as long as he or she wants. When your baby unlatches or falls asleep while feeding from the first breast, offer the  second breast. Because newborns are often sleepy in the first few weeks of life, you may need to awaken your baby to get him or her to feed. Breastfeeding times will vary from baby to baby. However, the following rules can serve as a guide to help you make sure that your baby is properly fed:  Newborns (babies 4 weeks of age or younger) may breastfeed every 1-3 hours.  Newborns should not go without breastfeeding for longer than 3 hours during the day or 5 hours during the night.  You should breastfeed your baby a minimum of 8 times in a 24-hour period. Breast milk pumping     Pumping and storing breast milk allows you to make sure that your baby is exclusively fed your breast milk, even at times when you are unable to breastfeed. This is especially important if you go back to work while you are still breastfeeding, or if you are not able to be present during feedings. Your lactation consultant can help you find a method of pumping that works best for you and give you guidelines about how long it is safe to store breast milk. Caring for your breasts while you breastfeed Nipples can become dry, cracked, and sore while breastfeeding. The following recommendations can help keep your breasts moisturized and healthy:  Avoid using soap on your nipples.  Wear a supportive bra designed especially for nursing. Avoid wearing underwire-style bras or extremely tight bras (sports bras).  Air-dry your nipples for 3-4 minutes after each feeding.  Use only cotton bra pads to absorb leaked breast milk. Leaking of breast milk between feedings is normal.  Use lanolin on your nipples after breastfeeding. Lanolin helps   to maintain your skin's normal moisture barrier. Pure lanolin is not harmful (not toxic) to your baby. You may also hand express a few drops of breast milk and gently massage that milk into your nipples and allow the milk to air-dry. In the first few weeks after giving birth, some women experience  breast engorgement. Engorgement can make your breasts feel heavy, warm, and tender to the touch. Engorgement peaks within 3-5 days after you give birth. The following recommendations can help to ease engorgement:  Completely empty your breasts while breastfeeding or pumping. You may want to start by applying warm, moist heat (in the shower or with warm, water-soaked hand towels) just before feeding or pumping. This increases circulation and helps the milk flow. If your baby does not completely empty your breasts while breastfeeding, pump any extra milk after he or she is finished.  Apply ice packs to your breasts immediately after breastfeeding or pumping, unless this is too uncomfortable for you. To do this: ? Put ice in a plastic bag. ? Place a towel between your skin and the bag. ? Leave the ice on for 20 minutes, 2-3 times a day.  Make sure that your baby is latched on and positioned properly while breastfeeding. If engorgement persists after 48 hours of following these recommendations, contact your health care provider or a lactation consultant. Overall health care recommendations while breastfeeding  Eat 3 healthy meals and 3 snacks every day. Well-nourished mothers who are breastfeeding need an additional 450-500 calories a day. You can meet this requirement by increasing the amount of a balanced diet that you eat.  Drink enough water to keep your urine pale yellow or clear.  Rest often, relax, and continue to take your prenatal vitamins to prevent fatigue, stress, and low vitamin and mineral levels in your body (nutrient deficiencies).  Do not use any products that contain nicotine or tobacco, such as cigarettes and e-cigarettes. Your baby may be harmed by chemicals from cigarettes that pass into breast milk and exposure to secondhand smoke. If you need help quitting, ask your health care provider.  Avoid alcohol.  Do not use illegal drugs or marijuana.  Talk with your health care  provider before taking any medicines. These include over-the-counter and prescription medicines as well as vitamins and herbal supplements. Some medicines that may be harmful to your baby can pass through breast milk.  It is possible to become pregnant while breastfeeding. If birth control is desired, ask your health care provider about options that will be safe while breastfeeding your baby. Where to find more information: [employer]: www.llli.org Contact a health care provider if:  You feel like you want to stop breastfeeding or have become frustrated with breastfeeding.  Your nipples are cracked or bleeding.  Your breasts are red, tender, or warm.  You have: ? Painful breasts or nipples. ? A swollen area on either breast. ? A fever or chills. ? Nausea or vomiting. ? Drainage other than breast milk from your nipples.  Your breasts do not become full before feedings by the fifth day after you give birth.  You feel sad and depressed.  Your baby is: ? Too sleepy to eat well. ? Having trouble sleeping. ? More than 1 week old and wetting fewer than 6 diapers in a 24-hour period. ? Not gaining weight by 5 days of age.  Your baby has fewer than 3 stools in a 24-hour period.  Your baby's skin or the white parts of   his or her eyes become yellow. Get help right away if:  Your baby is overly tired (lethargic) and does not want to wake up and feed.  Your baby develops an unexplained fever. Summary  Breastfeeding offers many health benefits for infant and mothers.  Try to breastfeed your infant when he or she shows early signs of hunger.  Gently tickle or stroke your baby's lips with your finger or nipple to allow the baby to open his or her mouth. Bring the baby to your breast. Make sure that much of the areola is in your baby's mouth. Offer one side and burp the baby before you offer the other side.  Talk with your health care provider or lactation consultant  if you have questions or you face problems as you breastfeed. This information is not intended to replace advice given to you by your health care provider. Make sure you discuss any questions you have with your health care provider. Document Revised: 03/18/2017 Document Reviewed: 01/24/2016 Elsevier Patient Education  2020 Elsevier Inc.  

## 2019-04-06 NOTE — Progress Notes (Signed)
Prenatal gummy rx resent with correct dosage.

## 2019-04-07 LAB — OBSTETRIC PANEL, INCLUDING HIV
Antibody Screen: NEGATIVE
Basophils Absolute: 0 10*3/uL (ref 0.0–0.2)
Basos: 0 %
EOS (ABSOLUTE): 0 10*3/uL (ref 0.0–0.4)
Eos: 0 %
HIV Screen 4th Generation wRfx: NONREACTIVE
Hematocrit: 37.8 % (ref 34.0–46.6)
Hemoglobin: 12 g/dL (ref 11.1–15.9)
Hepatitis B Surface Ag: NEGATIVE
Immature Grans (Abs): 0 10*3/uL (ref 0.0–0.1)
Immature Granulocytes: 0 %
Lymphocytes Absolute: 1 10*3/uL (ref 0.7–3.1)
Lymphs: 10 %
MCH: 24.9 pg — ABNORMAL LOW (ref 26.6–33.0)
MCHC: 31.7 g/dL (ref 31.5–35.7)
MCV: 79 fL (ref 79–97)
Monocytes Absolute: 0.5 10*3/uL (ref 0.1–0.9)
Monocytes: 5 %
Neutrophils Absolute: 7.8 10*3/uL — ABNORMAL HIGH (ref 1.4–7.0)
Neutrophils: 85 %
Platelets: 364 10*3/uL (ref 150–450)
RBC: 4.81 x10E6/uL (ref 3.77–5.28)
RDW: 16.4 % — ABNORMAL HIGH (ref 11.7–15.4)
RPR Ser Ql: NONREACTIVE
Rh Factor: POSITIVE
Rubella Antibodies, IGG: 3.5 index (ref >0.99)
WBC: 9.3 10*3/uL (ref 3.4–10.8)

## 2019-04-07 LAB — CERVICOVAGINAL ANCILLARY ONLY
Bacterial Vaginitis (gardnerella): NEGATIVE
Candida Glabrata: NEGATIVE
Candida Vaginitis: NEGATIVE
Chlamydia: NEGATIVE
Comment: NEGATIVE
Comment: NEGATIVE
Comment: NEGATIVE
Comment: NEGATIVE
Comment: NEGATIVE
Comment: NORMAL
Neisseria Gonorrhea: NEGATIVE
Trichomonas: NEGATIVE

## 2019-04-07 LAB — HEPATITIS C ANTIBODY: Hep C Virus Ab: <0.1 s/co ratio (ref 0.0–0.9)

## 2019-04-09 LAB — CULTURE, OB URINE

## 2019-04-09 LAB — URINE CULTURE, OB REFLEX

## 2019-04-13 ENCOUNTER — Encounter: Payer: Self-pay | Admitting: Obstetrics and Gynecology

## 2019-04-18 ENCOUNTER — Encounter: Payer: Self-pay | Admitting: Obstetrics and Gynecology

## 2019-04-18 DIAGNOSIS — Z3401 Encounter for supervision of normal first pregnancy, first trimester: Secondary | ICD-10-CM

## 2019-04-18 DIAGNOSIS — D563 Thalassemia minor: Secondary | ICD-10-CM | POA: Insufficient documentation

## 2019-05-04 ENCOUNTER — Telehealth (INDEPENDENT_AMBULATORY_CARE_PROVIDER_SITE_OTHER): Admitting: Obstetrics

## 2019-05-04 ENCOUNTER — Encounter: Payer: Self-pay | Admitting: Obstetrics

## 2019-05-04 VITALS — BP 105/66 | HR 107

## 2019-05-04 DIAGNOSIS — D563 Thalassemia minor: Secondary | ICD-10-CM

## 2019-05-04 DIAGNOSIS — Z3400 Encounter for supervision of normal first pregnancy, unspecified trimester: Secondary | ICD-10-CM

## 2019-05-04 DIAGNOSIS — M549 Dorsalgia, unspecified: Secondary | ICD-10-CM

## 2019-05-04 MED ORDER — AMOXICILLIN-POT CLAVULANATE 875-125 MG PO TABS: 1.0000 | ORAL_TABLET | Freq: Two times a day (BID) | ORAL | 0 refills | Status: DC

## 2019-05-04 MED ORDER — COMFORT FIT MATERNITY SUPP SM MISC: 0 refills | Status: DC

## 2019-05-04 NOTE — Progress Notes (Signed)
   OBSTETRICS PRENATAL VIRTUAL VISIT ENCOUNTER NOTE  Provider location: [facility]   I connected with [name] on 05/04/19 at 10:00 AM EDT by MyChart Video Encounter at home and verified that I am speaking with the correct person using two identifiers.   I discussed the limitations, risks, security and privacy concerns of performing an evaluation and management service virtually and the availability of in person appointments. I also discussed with the patient that there may be a patient responsible charge related to this service. The patient expressed understanding and agreed to proceed. Subjective:  Cassandra Bradley is a 19 y.o. G1P0 at [redacted]w[redacted]d being seen today for ongoing prenatal care.  She is currently monitored for the following issues for this low-risk pregnancy and has Encounter for supervision of normal first pregnancy in first trimester and Alpha thalassemia silent carrier on their problem list.  Patient reports backache.  Contractions: Not present. Vag. Bleeding: None.   . Denies any leaking of fluid.   The following portions of the patient's history were reviewed and updated as appropriate: allergies, current medications, past family history, past medical history, past social history, past surgical history and problem list.   Objective:   Vitals:   05/04/19 0922  BP: 105/66  Pulse: (!) 107    Fetal Status:           General:  Alert, oriented and cooperative. Patient is in no acute distress.  Respiratory: Normal respiratory effort, no problems with respiration noted  Mental Status: Normal mood and affect. Normal behavior. Normal judgment and thought content.  Rest of physical exam deferred due to type of encounter  Imaging: No results found.  Assessment and Plan:  Pregnancy: G1P0 at [redacted]w[redacted]d 1. Supervision of normal first pregnancy, antepartum  2. Backache symptom Rx: - Elastic Bandages & Supports (COMFORT FIT MATERNITY SUPP SM) MISC; Wear as  directed.  Dispense: 1 each; Refill: 0  3. Alpha thalassemia silent carrier   Preterm labor symptoms and general obstetric precautions including but not limited to vaginal bleeding, contractions, leaking of fluid and fetal movement were reviewed in detail with the patient. I discussed the assessment and treatment plan with the patient. The patient was provided an opportunity to ask questions and all were answered. The patient agreed with the plan and demonstrated an understanding of the instructions. The patient was advised to call back or seek an in-person office evaluation/go to [facility] at [facility] for any urgent or concerning symptoms. Please refer to After Visit Summary for other counseling recommendations.   I provided 10 minutes of face-to-face time during this encounter.  Return in about 4 weeks (around 06/01/2019) for MyChart.  Future Appointments  Date Time Provider Department Center  05/04/2019 10:00 AM Brock Bad, MD [facility] None  05/25/2019 10:45 AM [facility] [place] 5 [facility]  05/25/2019  1:30 PM [facility]    Coral Ceo, MD [facility] [employer] 05/04/2019

## 2019-05-04 NOTE — Progress Notes (Signed)
Patient presents for Mychart ROB. Patient identified with 2 patient identifiers. She is 16w today. Her bp today 105/66. Patient has no concerns today.

## 2019-05-22 ENCOUNTER — Encounter: Payer: Self-pay | Admitting: Obstetrics and Gynecology

## 2019-05-25 ENCOUNTER — Other Ambulatory Visit: Payer: Self-pay

## 2019-05-25 ENCOUNTER — Other Ambulatory Visit: Payer: Self-pay | Admitting: *Deleted

## 2019-05-25 ENCOUNTER — Other Ambulatory Visit

## 2019-05-25 ENCOUNTER — Other Ambulatory Visit: Payer: Self-pay | Admitting: Obstetrics and Gynecology

## 2019-05-25 ENCOUNTER — Ambulatory Visit (HOSPITAL_COMMUNITY): Attending: Obstetrics and Gynecology

## 2019-05-25 DIAGNOSIS — Z363 Encounter for antenatal screening for malformations: Secondary | ICD-10-CM

## 2019-05-25 DIAGNOSIS — Z3401 Encounter for supervision of normal first pregnancy, first trimester: Secondary | ICD-10-CM | POA: Insufficient documentation

## 2019-05-25 DIAGNOSIS — Z148 Genetic carrier of other disease: Secondary | ICD-10-CM

## 2019-05-25 DIAGNOSIS — Z362 Encounter for other antenatal screening follow-up: Secondary | ICD-10-CM

## 2019-05-25 DIAGNOSIS — Z3A19 19 weeks gestation of pregnancy: Secondary | ICD-10-CM | POA: Diagnosis not present

## 2019-05-27 LAB — AFP, SERUM, OPEN SPINA BIFIDA
AFP MoM: 0.68
AFP Value: 45.5 ng/mL
Gest. Age on Collection Date: 19 weeks
Maternal Age At EDD: 20.4 yr
OSBR Risk 1 IN: 10000
Test Results:: NEGATIVE
Weight: 117 [lb_av]

## 2019-06-01 ENCOUNTER — Telehealth (INDEPENDENT_AMBULATORY_CARE_PROVIDER_SITE_OTHER): Admitting: Obstetrics

## 2019-06-01 ENCOUNTER — Encounter: Payer: Self-pay | Admitting: Obstetrics

## 2019-06-01 DIAGNOSIS — Z3401 Encounter for supervision of normal first pregnancy, first trimester: Secondary | ICD-10-CM

## 2019-06-01 DIAGNOSIS — Z3A20 20 weeks gestation of pregnancy: Secondary | ICD-10-CM

## 2019-06-01 NOTE — Progress Notes (Signed)
OBSTETRICS PRENATAL VIRTUAL VISIT ENCOUNTER NOTE  Provider location: Center for [facility]   I connected with Cassandra Bradley on 06/01/19 at 10:00 AM EDT by MyChart Video Encounter at home and verified that I am speaking with the correct person using two identifiers.   I discussed the limitations, risks, security and privacy concerns of performing an evaluation and management service virtually and the availability of in person appointments. I also discussed with the patient that there may be a patient responsible charge related to this service. The patient expressed understanding and agreed to proceed. Subjective:  Cassandra Bradley is a 19 y.o. G1P0 at [redacted]w[redacted]d being seen today for ongoing prenatal care.  She is currently monitored for the following issues for this low-risk pregnancy and has Encounter for supervision of normal first pregnancy in first trimester and Alpha thalassemia silent carrier on their problem list.  Patient reports no complaints.  Contractions: Not present. Vag. Bleeding: None.  Movement: Present. Denies any leaking of fluid.   The following portions of the patient's history were reviewed and updated as appropriate: allergies, current medications, past family history, past medical history, past social history, past surgical history and problem list.   Objective:   Vitals:   06/01/19 0940  BP: 110/71  Pulse: 68    Fetal Status:     Movement: Present     General:  Alert, oriented and cooperative. Patient is in no acute distress.  Respiratory: Normal respiratory effort, no problems with respiration noted  Mental Status: Normal mood and affect. Normal behavior. Normal judgment and thought content.  Rest of physical exam deferred due to type of encounter  Imaging: [place] MFM OB DETAIL +14 WK  Result Date: 05/25/2019 ----------------------------------------------------------------------  OBSTETRICS REPORT                       (Signed Final 05/25/2019 12:10 pm)  ---------------------------------------------------------------------- Patient Info  ID #:       [id number]                          D.O.B.:  Sep 10, 2000 (19 yrs)  Name:       Cassandra Bradley                     Visit Date: 05/25/2019 10:26 am ---------------------------------------------------------------------- Performed By  Attending:        Ma Rings MD         Ref. Address:     Faculty  Performed By:     Tomma Lightning             [facility]                                 Fetal Care  Referred By:      Gigi Gin                    CONSTANT MD ---------------------------------------------------------------------- Orders  #  Description                           Code        Ordered By  1  [place] MFM OB  DETAIL +14 WK               76811.01    PEGGY CONSTANT ----------------------------------------------------------------------  #  Order #                     Accession #                Episode #  1  [id number]                   [id number]                 [id number] ---------------------------------------------------------------------- Indications  Encounter for antenatal screening for          Z36.3  malformations (LR NIPS)  [redacted] weeks gestation of pregnancy                Z3A.19  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 ---------------------------------------------------------------------- Fetal Evaluation  Num Of Fetuses:         1  Fetal Heart Rate(bpm):  154  Cardiac Activity:       Observed  Presentation:           Breech  Placenta:               Anterior  P. Cord Insertion:      Visualized  Amniotic Fluid  AFI FV:      Within normal limits                              Largest Pocket(cm)                              4.4 ---------------------------------------------------------------------- Biometry  BPD:        41  mm     G. Age:  18w 3d         26  %    CI:        70.64   %    70 - 86                                                          FL/HC:      19.6   %     16.1 - 18.3  HC:      155.5  mm     G. Age:  18w 3d         19  %    HC/AC:      1.18        1.09 - 1.39  AC:      131.5  mm     G. Age:  18w 5d         34  %    FL/BPD:     74.4   %  FL:       30.5  mm     G. Age:  19w 3d         60  %    FL/AC:      23.2   %    20 - 24  HUM:      29.3  mm     G. Age:  19w 4d  65  %  CER:      19.1  mm     G. Age:  18w 4d         37  %  NFT:       3.8  mm  LV:        6.2  mm  CM:        4.5  mm  Est. FW:     266  gm      0 lb 9 oz     42  % ---------------------------------------------------------------------- OB History  Gravidity:    1 ---------------------------------------------------------------------- Gestational Age  LMP:           17w 6d        Date:  01/20/19                 EDD:   10/27/19  U/S Today:     18w 5d                                        EDD:   10/21/19  Best:          19w 0d     Det. ByMarcella Dubs         EDD:   10/19/19                                      (02/26/19) ---------------------------------------------------------------------- Anatomy  Cranium:               Appears normal         LVOT:                   Appears normal  Cavum:                 Appears normal         Aortic Arch:            Appears normal  Ventricles:            Appears normal         Ductal Arch:            Appears normal  Choroid Plexus:        Appears normal         Diaphragm:              Appears normal  Cerebellum:            Appears normal         Stomach:                Appears normal, left                                                                        sided  Posterior Fossa:       Appears normal         Abdomen:                Appears normal  Nuchal Fold:  Appears normal         Abdominal Wall:         Appears nml (cord                                                                        insert, abd wall)  Face:                  Appears normal         Cord Vessels:           Appears normal (3                         (orbits and profile)                            vessel cord)  Lips:                  Appears normal         Kidneys:                Appear normal  Palate:                Not well visualized    Bladder:                Appears normal  Thoracic:              Appears normal         Spine:                  Not well visualized  Heart:                 Appears normal         Upper Extremities:      Appears normal                         (4CH, axis, and                         situs)  RVOT:                  Appears normal         Lower Extremities:      Appears normal  Other:  Heels and 5th digit visualized. Parents do not wish to know sex of          fetus. Nasal bone visualized. Technically difficult due to fetal position. ---------------------------------------------------------------------- Cervix Uterus Adnexa  Cervix  Length:           3.75  cm.  Normal appearance by transabdominal scan.  Uterus  No abnormality visualized.  Right Ovary  Not visualized.  Left Ovary  Size(cm)       4.9  x    2     x  2.6       Vol(ml): 13.34  Within normal limits. ---------------------------------------------------------------------- Comments  This patient was seen for a detailed fetal anatomy scan.  She denies any significant past medical history and denies  any problems in her current pregnancy.  She had a cell free DNA test earlier in her pregnancy which  indicated a low risk for trisomy 50, 63, and 13.  The patient  did not want the fetal gender revealed.  She was informed that the fetal growth and amniotic fluid  level were appropriate for her gestational age.  There were no obvious fetal anomalies noted on today's  ultrasound exam.  However, the views of the fetal spine were  unable to be fully visualized today.  The patient was informed that anomalies may be missed due  to technical limitations. If the fetus is in a suboptimal position  or maternal habitus is increased, visualization of the fetus in  the maternal uterus may be impaired.  A follow-up exam  was scheduled in 4 weeks to complete the  views of the fetal anatomy. ----------------------------------------------------------------------                   Ma Rings, MD Electronically Signed Final Report   05/25/2019 12:10 pm ----------------------------------------------------------------------   Assessment and Plan:  Pregnancy: G1P0 at [redacted]w[redacted]d 1. Encounter for supervision of normal first pregnancy in first trimester   Preterm labor symptoms and general obstetric precautions including but not limited to vaginal bleeding, contractions, leaking of fluid and fetal movement were reviewed in detail with the patient. I discussed the assessment and treatment plan with the patient. The patient was provided an opportunity to ask questions and all were answered. The patient agreed with the plan and demonstrated an understanding of the instructions. The patient was advised to call back or seek an in-person office evaluation/go to [facility]U at [facility] for any urgent or concerning symptoms. Please refer to After Visit Summary for other counseling recommendations.   I provided 10 minutes of face-to-face time during this encounter.  Return in about 4 weeks (around 06/29/2019) for MyChart.  Future Appointments  Date Time Provider Department Center  06/01/2019 10:00 AM Brock Bad, MD [facility] None  06/22/2019  8:00 AM [facility] NURSE [facility]  06/22/2019  8:00 AM [facility] US1 [facility]    Coral Ceo, MD Center for [facility], [facility] 06/01/2019

## 2019-06-01 NOTE — Progress Notes (Signed)
Virtual Visit via Telephone Note  I connected with Cassandra Bradley on 06/01/19 at 10:00 AM EDT by telephone and verified that I am speaking with the correct person using two identifiers.  ROB without complaints

## 2019-06-22 ENCOUNTER — Ambulatory Visit: Payer: Self-pay | Admitting: Genetic Counselor

## 2019-06-22 ENCOUNTER — Other Ambulatory Visit: Payer: Self-pay | Admitting: *Deleted

## 2019-06-22 ENCOUNTER — Other Ambulatory Visit: Payer: Self-pay

## 2019-06-22 ENCOUNTER — Ambulatory Visit: Admitting: *Deleted

## 2019-06-22 ENCOUNTER — Ambulatory Visit: Attending: Obstetrics and Gynecology

## 2019-06-22 VITALS — BP 115/73 | HR 92

## 2019-06-22 DIAGNOSIS — O0990 Supervision of high risk pregnancy, unspecified, unspecified trimester: Secondary | ICD-10-CM

## 2019-06-22 DIAGNOSIS — Z3A23 23 weeks gestation of pregnancy: Secondary | ICD-10-CM | POA: Diagnosis not present

## 2019-06-22 DIAGNOSIS — O283 Abnormal ultrasonic finding on antenatal screening of mother: Secondary | ICD-10-CM

## 2019-06-22 DIAGNOSIS — Z3401 Encounter for supervision of normal first pregnancy, first trimester: Secondary | ICD-10-CM | POA: Insufficient documentation

## 2019-06-22 DIAGNOSIS — D563 Thalassemia minor: Secondary | ICD-10-CM

## 2019-06-22 DIAGNOSIS — Z362 Encounter for other antenatal screening follow-up: Secondary | ICD-10-CM | POA: Diagnosis not present

## 2019-06-22 DIAGNOSIS — Z148 Genetic carrier of other disease: Secondary | ICD-10-CM | POA: Diagnosis not present

## 2019-06-22 DIAGNOSIS — Z363 Encounter for antenatal screening for malformations: Secondary | ICD-10-CM

## 2019-06-22 NOTE — Progress Notes (Signed)
I briefly met with Cassandra Bradley during her ultrasound today to discuss results from her partner's carrier screening. Cassandra Bradley was identified as a silent carrier for alpha-thalassemia on [employer]-14 carrier screening. Her partner, Cassandra Bradley, then underwent carrier screening and was also identified as a silent carrier for alpha-thalassemia.   We briefly reviewed features associated with alpha-thalassemia. We also discussed that people can be several different "types" of carriers for alpha-thalassemia. Her partner would need to be a very specific type of carrier (a carrier in cis, or aa/--), in order for the couple to have a chance of having a child with alpha-thalassemia. If her partner were found to be a silent carrier, a carrier in trans (a-/a-), or not a carrier at all, the couple would have a significantly decreased chance of having a child with alpha-thalassemia. Given her partner's carrier screening results, the current fetus and the couple's future children are not at increased risk of being affected by alpha-thalassemia.   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. Cassandra Bradley was not identified as a carrier for an HBB-related hemoglobinopathy; thus, the couple's chance of having a child with an HBB-related hemoglobinopathy such as sickle disease is significantly reduced. Per Cassandra Bradley, based on the sensitivity of the screen, the couple reportedly has a 1 in 564 (0.2%) chance of having a child with an HBB-related hemoglobinopathy.  Cassandra Bradley confirmed that she had no further questions at this time.   Buelah Manis, MS, [facility] Genetic Counselor

## 2019-06-29 ENCOUNTER — Telehealth (INDEPENDENT_AMBULATORY_CARE_PROVIDER_SITE_OTHER): Admitting: Obstetrics

## 2019-06-29 ENCOUNTER — Encounter: Payer: Self-pay | Admitting: Obstetrics

## 2019-06-29 DIAGNOSIS — Z3402 Encounter for supervision of normal first pregnancy, second trimester: Secondary | ICD-10-CM

## 2019-06-29 DIAGNOSIS — Z3401 Encounter for supervision of normal first pregnancy, first trimester: Secondary | ICD-10-CM

## 2019-06-29 DIAGNOSIS — Z3A24 24 weeks gestation of pregnancy: Secondary | ICD-10-CM

## 2019-06-29 NOTE — Progress Notes (Signed)
OBSTETRICS PRENATAL VIRTUAL VISIT ENCOUNTER NOTE  Provider location: Center for [facility]   I connected with Cassandra Bradley on 06/29/19 at  8:15 AM EDT by MyChart Video Encounter at home and verified that I am speaking with the correct person using two identifiers.   I discussed the limitations, risks, security and privacy concerns of performing an evaluation and management service virtually and the availability of in person appointments. I also discussed with the patient that there may be a patient responsible charge related to this service. The patient expressed understanding and agreed to proceed. Subjective:  Cassandra Bradley is a 19 y.o. G1P0 at [redacted]w[redacted]d being seen today for ongoing prenatal care.  She is currently monitored for the following issues for this low-risk pregnancy and has Encounter for supervision of normal first pregnancy in first trimester and Alpha thalassemia silent carrier on their problem list.  Patient reports no complaints.  Contractions: Not present. Vag. Bleeding: None.  Movement: Present. Denies any leaking of fluid.   The following portions of the patient's history were reviewed and updated as appropriate: allergies, current medications, past family history, past medical history, past social history, past surgical history and problem list.   Objective:   Vitals:   06/29/19 0818  BP: 115/67  Pulse: (!) 108    Fetal Status:     Movement: Present     General:  Alert, oriented and cooperative. Patient is in no acute distress.  Respiratory: Normal respiratory effort, no problems with respiration noted  Mental Status: Normal mood and affect. Normal behavior. Normal judgment and thought content.  Rest of physical exam deferred due to type of encounter  Imaging: [place] MFM OB FOLLOW UP  Result Date: 06/22/2019 ----------------------------------------------------------------------  OBSTETRICS REPORT                       (Signed Final 06/22/2019 09:55 am)  ---------------------------------------------------------------------- Patient Info  ID #:       [id number]                          D.O.B.:  Dec 18, 2000 (19 yrs)  Name:       Cassandra Bradley                     Visit Date: 06/22/2019 09:01 am ---------------------------------------------------------------------- Performed By  Attending:        Noralee Space MD        Ref. Address:     Faculty  Performed By:     Emeline Darling BS,      [facility]                                     Fetal Care  Referred By:      Gigi Gin                    CONSTANT MD ---------------------------------------------------------------------- Orders  #  Description                           Code        Ordered By  1  [place] MFM OB FOLLOW  UP                   94765.46    Rosana Hoes ----------------------------------------------------------------------  #  Order #                     Accession #                Episode #  1  [id number]                   [id number]                 [id number] ---------------------------------------------------------------------- Indications  [redacted] weeks gestation of pregnancy                Z3A.23  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  Low Risk NIPS (Negative AFP)  Antenatal follow-up for nonvisualized fetal    Z36.2  anatomy  Pyelectasis of fetus on prenatal ultrasound    O28.3 ---------------------------------------------------------------------- Fetal Evaluation  Num Of Fetuses:         1  Fetal Heart Rate(bpm):  144  Cardiac Activity:       Observed  Presentation:           Cephalic  Placenta:               Anterior  P. Cord Insertion:      Previously Visualized  Amniotic Fluid  AFI FV:      Within normal limits                              Largest Pocket(cm)                              5.7 ---------------------------------------------------------------------- Biometry  BPD:      55.7  mm     G. Age:  23w 0d         45  %    CI:        76.34   %    70 - 86                                                           FL/HC:      19.9   %    19.2 - 20.8  HC:       202   mm     G. Age:  22w 2d         14  %    HC/AC:      1.12        1.05 - 1.21  AC:      179.6  mm     G. Age:  22w 6d         36  %    FL/BPD:     72.2   %    71 - 87  FL:       40.2  mm     G. Age:  23w 0d         38  %    FL/AC:      22.4   %    20 - 24  Est. FW:  540  gm      1 lb 3 oz     35  % ---------------------------------------------------------------------- OB History  Gravidity:    1 ---------------------------------------------------------------------- Gestational Age  LMP:           21w 6d        Date:  01/20/19                 EDD:   10/27/19  U/S Today:     22w 6d                                        EDD:   10/20/19  Best:          23w 0d     Det. ByLoman Chroman         EDD:   10/19/19                                      (02/26/19) ---------------------------------------------------------------------- Anatomy  Cranium:               Appears normal         LVOT:                   Appears normal  Cavum:                 Previously seen        Aortic Arch:            Appears normal  Ventricles:            Appears normal         Ductal Arch:            Appears normal  Choroid Plexus:        Previously seen        Diaphragm:              Appears normal  Cerebellum:            Previously seen        Stomach:                Appears normal, left                                                                        sided  Posterior Fossa:       Previously seen        Abdomen:                Appears normal  Nuchal Fold:           Previously seen        Abdominal Wall:         Previously seen  Face:                  Orbits and profile     Cord Vessels:           Previously seen  previously seen  Lips:                  Previously seen        Kidneys:                Bilateral UTD  Palate:                Not well visualized    Bladder:                Appears normal  Thoracic:               Appears normal         Spine:                  Limited views                                                                        appear normal  Heart:                 Previously seen        Upper Extremities:      Previously seen  RVOT:                  Previously seen        Lower Extremities:      Previously seen  Other:  Heels and 5th digit previously visualized. Parents do not wish to know          sex of fetus. Nasal bone previously visualized. Technically difficult          due to fetal position. ---------------------------------------------------------------------- Cervix Uterus Adnexa  Cervix  Length:           3.94  cm.  Normal appearance by transabdominal scan. ---------------------------------------------------------------------- Impression  Patient return for completion of fetal anatomy.  On cell free  fetal DNA screening, the risks of fetal aneuploidies are not  increased.  On today's ultrasound, amniotic fluid is normal and good fetal  activity seen.  Fetal growth is appropriate for gestational age.  Fetal spine appears normal.  Bilateral mild urinary tract  dilations, measuring 4 to 5 mm, or seen.  I explained the findings and reassured her that in most cases  UTD resolves during pregnancy or after birth.  However, it  can be associated with obstructive uropathy in some cases.  Given that she had low risk for fetal Down syndrome on cell  free fetal DNA screening, this finding should not be  considered a marker for Down syndrome. ---------------------------------------------------------------------- Recommendations  -An appointment was made for her to return in 8 weeks for  fetal growth and renal assessments. ----------------------------------------------------------------------                  Noralee Spaceavi Shankar, MD Electronically Signed Final Report   06/22/2019 09:55 am ----------------------------------------------------------------------   Assessment and Plan:  Pregnancy: G1P0 at  7941w0d 1. Encounter for supervision of normal first pregnancy in first trimester   Preterm labor symptoms and general obstetric precautions including but not limited to vaginal bleeding, contractions, leaking of fluid and fetal movement were reviewed in detail with the patient. I discussed the assessment and treatment plan with the patient. The  patient was provided an opportunity to ask questions and all were answered. The patient agreed with the plan and demonstrated an understanding of the instructions. The patient was advised to call back or seek an in-person office evaluation/go to MAU at [facility] for any urgent or concerning symptoms. Please refer to After Visit Summary for other counseling recommendations.   I provided 10 minutes of face-to-face time during this encounter.  Return in about 4 weeks (around 07/27/2019) for ROB, 2 hour OGTT.  Future Appointments  Date Time Provider Department Center  08/17/2019  8:45 AM [facility] NURSE [facility]  08/17/2019  8:45 AM [facility] US4 [facility]    Coral Ceo, MD Center for [facility], [facility] 06/29/19

## 2019-06-29 NOTE — Progress Notes (Signed)
S/w pt for virtual visit. Pt reports fetal movement, denies pain. 

## 2019-07-26 NOTE — Progress Notes (Signed)
Subjective:  Cassandra Bradley is a 19 y.o. G1P0 at [redacted]w[redacted]d being seen today for ongoing prenatal care.  She is currently monitored for the following issues for this low-risk pregnancy and has Encounter for supervision of normal first pregnancy in first trimester; Alpha thalassemia silent carrier; and Uterine size date discrepancy pregnancy on their problem list.  Patient reports no complaints.  Contractions: Not present. Vag. Bleeding: None.  Movement: Present. Denies leaking of fluid.   The following portions of the patient's history were reviewed and updated as appropriate: allergies, current medications, past family history, past medical history, past social history, past surgical history and problem list. Problem list updated.  Objective:   Vitals:   07/27/19 0819  BP: 114/71  Pulse: 94  Weight: 129 lb (58.5 kg)    Fetal Status: Fetal Heart Rate (bpm): 140 Fundal Height: 24 cm Movement: Present     General:  Alert, oriented and cooperative. Patient is in no acute distress.  Skin: Skin is warm and dry. No rash noted.   Cardiovascular: Normal heart rate noted  Respiratory: Normal respiratory effort, no problems with respiration noted  Abdomen: Soft, gravid, appropriate for gestational age. Pain/Pressure: Absent     Pelvic: Vag. Bleeding: None     Cervical exam deferred        Extremities: Normal range of motion.     Mental Status: Normal mood and affect. Normal behavior. Normal judgment and thought content.   Urinalysis:      Assessment and Plan:  Pregnancy: G1P0 at [redacted]w[redacted]d  1. Encounter for supervision of normal first pregnancy in first trimester - f/u US for fetal growth and renal assessment scheduled for 08/17/2019, pt aware - contraception discussed, pt elects depo, information given - peds list given - Tdap vaccine today - CBC - HIV antibody (with reflex) - RPR - Glucose Tolerance, 2 Hours w/1 Hour  2. Alpha thalassemia silent carrier -GC completed 06/22/2019  3. Uterine  size date discrepancy pregnancy - S<D, FH 24 - [place] MFM OB FOLLOW UP; Future   Preterm labor symptoms and general obstetric precautions including but not limited to vaginal bleeding, contractions, leaking of fluid and fetal movement were reviewed in detail with the patient. Please refer to After Visit Summary for other counseling recommendations.  Return in about 2 weeks (around 08/10/2019) for virtual LOB/APP OK, needs growth [place] ASAP.   Kirill Chatterjee, Odie Sera, NP

## 2019-07-27 ENCOUNTER — Ambulatory Visit (INDEPENDENT_AMBULATORY_CARE_PROVIDER_SITE_OTHER): Admitting: Women's Health

## 2019-07-27 ENCOUNTER — Other Ambulatory Visit: Payer: Self-pay

## 2019-07-27 ENCOUNTER — Other Ambulatory Visit

## 2019-07-27 VITALS — BP 114/71 | HR 94 | Wt 129.0 lb

## 2019-07-27 DIAGNOSIS — Z3A28 28 weeks gestation of pregnancy: Secondary | ICD-10-CM

## 2019-07-27 DIAGNOSIS — O26849 Uterine size-date discrepancy, unspecified trimester: Secondary | ICD-10-CM | POA: Insufficient documentation

## 2019-07-27 DIAGNOSIS — Z23 Encounter for immunization: Secondary | ICD-10-CM | POA: Diagnosis not present

## 2019-07-27 DIAGNOSIS — O26843 Uterine size-date discrepancy, third trimester: Secondary | ICD-10-CM

## 2019-07-27 DIAGNOSIS — Z3401 Encounter for supervision of normal first pregnancy, first trimester: Secondary | ICD-10-CM

## 2019-07-27 DIAGNOSIS — D563 Thalassemia minor: Secondary | ICD-10-CM

## 2019-07-27 NOTE — Patient Instructions (Addendum)
Maternity Assessment Unit (MAU)  The Maternity Assessment Unit (MAU) is located at the [facility] at [facility]. The address is: [address], [address], [place], [place] [address]. Please see map below for additional directions.    The Maternity Assessment Unit is designed to help you during your pregnancy, and for up to 6 weeks after delivery, with any pregnancy- or postpartum-related emergencies, if you think you are in labor, or if your water has broken. For example, if you experience nausea and vomiting, vaginal bleeding, severe abdominal or pelvic pain, elevated blood pressure or other problems related to your pregnancy or postpartum time, please come to the Maternity Assessment Unit for assistance.        Contraception Choices Contraception, also called birth control, refers to methods or devices that prevent pregnancy. Hormonal methods Contraceptive implant  A contraceptive implant is a thin, plastic tube that contains a hormone. It is inserted into the upper part of the arm. It can remain in place for up to 3 years. Progestin-only injections Progestin-only injections are injections of progestin, a synthetic form of the hormone progesterone. They are given every 3 months by a health care provider. Birth control pills  Birth control pills are pills that contain hormones that prevent pregnancy. They must be taken once a day, preferably at the same time each day. Birth control patch  The birth control patch contains hormones that prevent pregnancy. It is placed on the skin and must be changed once a week for three weeks and removed on the fourth week. A prescription is needed to use this method of contraception. Vaginal ring  A vaginal ring contains hormones that prevent pregnancy. It is placed in the vagina for three weeks and removed on the fourth week. After that, the process is repeated with a new ring. A prescription is needed to use this  method of contraception. Emergency contraceptive Emergency contraceptives prevent pregnancy after unprotected sex. They come in pill form and can be taken up to 5 days after sex. They work best the sooner they are taken after having sex. Most emergency contraceptives are available without a prescription. This method should not be used as your only form of birth control. Barrier methods Female condom  A female condom is a thin sheath that is worn over the penis during sex. Condoms keep sperm from going inside a woman's body. They can be used with a spermicide to increase their effectiveness. They should be disposed after a single use. Female condom  A female condom is a soft, loose-fitting sheath that is put into the vagina before sex. The condom keeps sperm from going inside a woman's body. They should be disposed after a single use. Diaphragm  A diaphragm is a soft, dome-shaped barrier. It is inserted into the vagina before sex, along with a spermicide. The diaphragm blocks sperm from entering the uterus, and the spermicide kills sperm. A diaphragm should be left in the vagina for 6-8 hours after sex and removed within 24 hours. A diaphragm is prescribed and fitted by a health care provider. A diaphragm should be replaced every 1-2 years, after giving birth, after gaining more than 15 lb (6.8 kg), and after pelvic surgery. Cervical cap  A cervical cap is a round, soft latex or plastic cup that fits over the cervix. It is inserted into the vagina before sex, along with spermicide. It blocks sperm from entering the uterus. The cap should be left in place for 6-8 hours   after sex and removed within 48 hours. A cervical cap must be prescribed and fitted by a health care provider. It should be replaced every 2 years. Sponge  A sponge is a soft, circular piece of polyurethane foam with spermicide on it. The sponge helps block sperm from entering the uterus, and the spermicide kills sperm. To use it, you  make it wet and then insert it into the vagina. It should be inserted before sex, left in for at least 6 hours after sex, and removed and thrown away within 30 hours. Spermicides Spermicides are chemicals that kill or block sperm from entering the cervix and uterus. They can come as a cream, jelly, suppository, foam, or tablet. A spermicide should be inserted into the vagina with an applicator at least 10-15 minutes before sex to allow time for it to work. The process must be repeated every time you have sex. Spermicides do not require a prescription. Intrauterine contraception Intrauterine device (IUD) An IUD is a T-shaped device that is put in a woman's uterus. There are two types:  Hormone IUD.This type contains progestin, a synthetic form of the hormone progesterone. This type can stay in place for 3-5 years.  Copper IUD.This type is wrapped in copper wire. It can stay in place for 10 years.  Permanent methods of contraception Female tubal ligation In this method, a woman's fallopian tubes are sealed, tied, or blocked during surgery to prevent eggs from traveling to the uterus. Hysteroscopic sterilization In this method, a small, flexible insert is placed into each fallopian tube. The inserts cause scar tissue to form in the fallopian tubes and block them, so sperm cannot reach an egg. The procedure takes about 3 months to be effective. Another form of birth control must be used during those 3 months. Female sterilization This is a procedure to tie off the tubes that carry sperm (vasectomy). After the procedure, the man can still ejaculate fluid (semen). Natural planning methods Natural family planning In this method, a couple does not have sex on days when the woman could become pregnant. Calendar method This means keeping track of the length of each menstrual cycle, identifying the days when pregnancy can happen, and not having sex on those days. Ovulation method In this method, a couple  avoids sex during ovulation. Symptothermal method This method involves not having sex during ovulation. The woman typically checks for ovulation by watching changes in her temperature and in the consistency of cervical mucus. Post-ovulation method In this method, a couple waits to have sex until after ovulation. Summary  Contraception, also called birth control, means methods or devices that prevent pregnancy.  Hormonal methods of contraception include implants, injections, pills, patches, vaginal rings, and emergency contraceptives.  Barrier methods of contraception can include female condoms, female condoms, diaphragms, cervical caps, sponges, and spermicides.  There are two types of IUDs (intrauterine devices). An IUD can be put in a woman's uterus to prevent pregnancy for 3-5 years.  Permanent sterilization can be done through a procedure for males, females, or both.  Natural family planning methods involve not having sex on days when the woman could become pregnant. This information is not intended to replace advice given to you by your health care provider. Make sure you discuss any questions you have with your health care provider. Document Revised: 12/24/2016 Document Reviewed: 01/25/2016 Elsevier Patient Education  2020 Elsevier Inc.       AREA PEDIATRIC/FAMILY PRACTICE PHYSICIANS  [facility] [address] [place], [place] [address] Phone - [phone]   Fax - [phone]  JACK AMOS [address] [place], [place]  [address] Phone - [phone]   Fax - [phone]  [facility] [address] [place], [place]  [address] Phone - [phone]   Fax - [phone]  [facility] [address] [place], [place]  [address] Phone - [phone]   Fax - [phone]  [facility] [address] [place], [place]  [address] Phone - [phone]   Fax - [phone]  [facility] [address] [place], [place]  [address] Phone - [phone]   Fax - [phone]  [facility] [address] [place], [place]  [address] Phone - [phone]   Fax - [phone]  [facility] AT [facility] [address] [place], [address] [place], [place]  [address] Phone - [phone]   Fax - [phone]  [facility] AT [facility] [address] [place], [place]  [address] Phone - [phone]   Fax - [phone] [facility] [place] [address] [place], [place]  [address] Phone - [phone]   Fax - [phone]  [facility] [address] [place], [place]  [address] Phone - [phone]   Fax - [phone]  [facility] [address] [place], [place]  [address] Phone - [phone]   Fax - [phone]  [facility] [place] [address] [place], [place]  [address] Phone - [phone]   Fax - [phone]  [facility] [address] [place], [place]  [address] Phone - [phone]  [facility] [address] [place], [place]  [address] Phone - [phone]   Fax - [phone]  [facility] [address] [place], [place]  [address] Phone - [phone]   Fax - [phone]  [facility] [address] [place], [place]  [address] Phone - [phone]   Fax - [phone]  [facility] [address] [place], [place]  [address] Phone - [phone]   Fax - [phone]   [facility] [address] [place], [place]  [address] Phone - [phone]   Fax - [phone]  [facility] [address] [place], [place]  [address] Phone - [phone]   Fax - [phone]  DAVID RUBIN [address] [place], [place]  [address] Phone -  [phone]   Fax - [phone]  [facility] [address] [place], [place]  [address] Phone - [phone]   Fax - [phone]  [facility] [address] [place], [place]  [address] Phone - [phone]   Fax - [phone] Gerarda Fraction [address] [place], [place]  [address] Phone - [phone]   Fax - [phone]  [facility] [address] [place], [place]  [address] Phone - [phone]   Fax - [phone]  [facility] - [place] [address] [place], [place]  [address] Phone - [phone]   Fax - [phone]        https://www.cdc.gov/vaccines/hcp/vis/vis-statements/tdap.pdf">  Tdap (Tetanus, Diphtheria, Pertussis) Vaccine: What You Need to Know 1. Why get vaccinated? Tdap vaccine can prevent tetanus, diphtheria, and pertussis. Diphtheria and pertussis spread from person to person. Tetanus enters the body through cuts or wounds.  TETANUS (T) causes painful stiffening of the muscles. Tetanus can lead to serious health problems, including being unable to open the mouth, having trouble swallowing and breathing, or death.  DIPHTHERIA (D) can lead to difficulty breathing, heart failure, paralysis, or death.  PERTUSSIS (aP), also known as "whooping cough," can cause uncontrollable, violent coughing which makes it hard to breathe, eat, or drink. Pertussis can be extremely serious in babies and young children, causing pneumonia, convulsions, brain damage, or death. In teens and adults, it can cause weight loss, loss of bladder control, passing out, and rib fractures from severe coughing. 2. Tdap vaccine Tdap is only for children 7 years and older, adolescents, and adults.  Adolescents should receive a single dose of Tdap, preferably at age 69 or 12 years. Pregnant women should get a dose of Tdap during every pregnancy, to protect the newborn from pertussis. Infants  are most at risk for severe, life-threatening complications from pertussis. Adults who have never received Tdap should get a dose of Tdap. Also, adults should receive a booster dose every 10 years, or earlier in the case of a severe and dirty wound or burn. Booster doses can be either Tdap or Td (a different vaccine that protects against tetanus and diphtheria but not pertussis). Tdap may be given at the same time as other vaccines. 3. Talk with your health care provider Tell your vaccine provider if the person getting the vaccine:  Has had an allergic reaction after a previous dose of any vaccine that protects against tetanus, diphtheria, or pertussis, or has any severe, life-threatening allergies.  Has had a coma, decreased level of consciousness, or prolonged seizures within 7 days after a previous dose of any pertussis vaccine (DTP, DTaP, or Tdap).  Has seizures or another nervous system problem.  Has ever had Guillain-Barr Syndrome (also called GBS).  Has had severe pain or swelling after a previous dose of any vaccine that protects against tetanus or diphtheria. In some cases, your health care provider may decide to postpone Tdap vaccination to a future visit.  People with minor illnesses, such as a cold, may be vaccinated. People who are moderately or severely ill should usually wait until they recover before getting Tdap vaccine.  Your health care provider can give you more information. 4. Risks of a vaccine reaction  Pain, redness, or swelling where the shot was given, mild fever, headache, feeling tired, and nausea, vomiting, diarrhea, or stomachache sometimes happen after Tdap vaccine. People sometimes faint after medical procedures, including vaccination. Tell your provider if you feel dizzy or have vision changes or ringing in the ears.  As with any medicine, there is a very remote chance of a vaccine causing a severe allergic reaction, other serious injury, or death. 5. What if  there is a serious problem? An allergic reaction could occur after the vaccinated person leaves the clinic. If you see signs of a severe allergic reaction (hives, swelling of the face and throat, difficulty breathing, a fast heartbeat, dizziness, or weakness), call 9-1-1 and get the person to the nearest hospital. For other signs that concern you, call your health care provider.  Adverse reactions should be reported to the Vaccine Adverse Event Reporting System (VAERS). Your health care provider will usually file this report, or you can do it yourself. Visit the VAERS website at www.vaers.LAgents.no or call 737-701-2185. VAERS is only for reporting reactions, and VAERS staff do not give medical advice. 6. The National Vaccine Injury Compensation Program The [employer] Vaccine Injury  Compensation Program Water quality scientist(VICP) is a Stage managerfederal program that was created to compensate people who may have been injured by certain vaccines. Visit the VICP website at SpiritualWord.atwww.hrsa.gov/vaccinecompensation or call 310-324-76201-425-316-5468 to learn about the program and about filing a claim. There is a time limit to file a claim for compensation. 7. How can I learn more?  Ask your health care provider.  Call your local or state health department.  Contact the Centers for Disease Control and Prevention (CDC): ? Call 602-317-68281-781-119-1965 (1-800-CDC-INFO) or ? Visit CDC's website at PicCapture.uywww.cdc.gov/vaccines Vaccine Information Statement Tdap (Tetanus, Diphtheria, Pertussis) Vaccine (04/06/2018) This information is not intended to replace advice given to you by your health care provider. Make sure you discuss any questions you have with your health care provider. Document Revised: 04/15/2018 Document Reviewed: 04/18/2018 Elsevier Patient Education  2020 Elsevier Inc.        Glucose Tolerance Test During Pregnancy Why am I having this test? The glucose tolerance test (GTT) is done to check how your body processes sugar (glucose). This is one of  several tests used to diagnose diabetes that develops during pregnancy (gestational diabetes mellitus). Gestational diabetes is a temporary form of diabetes that some women develop during pregnancy. It usually occurs during the second trimester of pregnancy and goes away after delivery. Testing (screening) for gestational diabetes usually occurs between 24 and 28 weeks of pregnancy. You may have the GTT test after having a 1-hour glucose screening test if the results from that test indicate that you may have gestational diabetes. You may also have this test if:  You have a history of gestational diabetes.  You have a history of giving birth to very large babies or have experienced repeated fetal loss (stillbirth).  You have signs and symptoms of diabetes, such as: ? Changes in your vision. ? Tingling or numbness in your hands or feet. ? Changes in hunger, thirst, and urination that are not otherwise explained by your pregnancy. What is being tested? This test measures the amount of glucose in your blood at different times during a period of 3 hours. This indicates how well your body is able to process glucose. What kind of sample is taken?  Blood samples are required for this test. They are usually collected by inserting a needle into a blood vessel. How do I prepare for this test?  For 3 days before your test, eat normally. Have plenty of carbohydrate-rich foods.  Follow instructions from your health care provider about: ? Eating or drinking restrictions on the day of the test. You may be asked to not eat or drink anything other than water (fast) starting 8-10 hours before the test. ? Changing or stopping your regular medicines. Some medicines may interfere with this test. Tell a health care provider about:  All medicines you are taking, including vitamins, herbs, eye drops, creams, and over-the-counter medicines.  Any blood disorders you have.  Any surgeries you have had.  Any  medical conditions you have. What happens during the test? First, your blood glucose will be measured. This is referred to as your fasting blood glucose, since you fasted before the test. Then, you will drink a glucose solution that contains a certain amount of glucose. Your blood glucose will be measured again 1, 2, and 3 hours after drinking the solution. This test takes about 3 hours to complete. You will need to stay at the testing location during this time. During the testing period:  Do not eat or drink anything other than  the glucose solution.  Do not exercise.  Do not use any products that contain nicotine or tobacco, such as cigarettes and e-cigarettes. If you need help stopping, ask your health care provider. The testing procedure may vary among health care providers and hospitals. How are the results reported? Your results will be reported as milligrams of glucose per deciliter of blood (mg/dL) or millimoles per liter (mmol/L). Your health care provider will compare your results to normal ranges that were established after testing a large group of people (reference ranges). Reference ranges may vary among labs and hospitals. For this test, common reference ranges are:  Fasting: less than 95-105 mg/dL (1.6-1.0 mmol/L).  1 hour after drinking glucose: less than 180-190 mg/dL (96.0-45.4 mmol/L).  2 hours after drinking glucose: less than 155-165 mg/dL (0.9-8.1 mmol/L).  3 hours after drinking glucose: 140-145 mg/dL (1.9-1.4 mmol/L). What do the results mean? Results within reference ranges are considered normal, meaning that your glucose levels are well-controlled. If two or more of your blood glucose levels are high, you may be diagnosed with gestational diabetes. If only one level is high, your health care provider may suggest repeat testing or other tests to confirm a diagnosis. Talk with your health care provider about what your results mean. Questions to ask your health care  provider Ask your health care provider, or the department that is doing the test:  When will my results be ready?  How will I get my results?  What are my treatment options?  What other tests do I need?  What are my next steps? Summary  The glucose tolerance test (GTT) is one of several tests used to diagnose diabetes that develops during pregnancy (gestational diabetes mellitus). Gestational diabetes is a temporary form of diabetes that some women develop during pregnancy.  You may have the GTT test after having a 1-hour glucose screening test if the results from that test indicate that you may have gestational diabetes. You may also have this test if you have any symptoms or risk factors for gestational diabetes.  Talk with your health care provider about what your results mean. This information is not intended to replace advice given to you by your health care provider. Make sure you discuss any questions you have with your health care provider. Document Revised: 04/14/2018 Document Reviewed: 08/03/2016 Elsevier Patient Education  2020 Elsevier Inc.       Medroxyprogesterone injection [Contraceptive] What is this medicine? MEDROXYPROGESTERONE (me DROX ee proe JES te rone) contraceptive injections prevent pregnancy. They provide effective birth control for 3 months. Depo-subQ Provera 104 is also used for treating pain related to endometriosis. This medicine may be used for other purposes; ask your health care provider or pharmacist if you have questions. COMMON BRAND NAME(S): Depo-Provera, Depo-subQ Provera 104 What should I tell my health care provider before I take this medicine? They need to know if you have any of these conditions:  frequently drink alcohol  asthma  blood vessel disease or a history of a blood clot in the lungs or legs  bone disease such as osteoporosis  breast cancer  diabetes  eating disorder (anorexia nervosa or bulimia)  high blood  pressure  HIV infection or AIDS  kidney disease  liver disease  mental depression  migraine  seizures (convulsions)  stroke  tobacco smoker  vaginal bleeding  an unusual or allergic reaction to medroxyprogesterone, other hormones, medicines, foods, dyes, or preservatives  pregnant or trying to get pregnant  breast-feeding How should I  use this medicine? Depo-Provera Contraceptive injection is given into a muscle. Depo-subQ Provera 104 injection is given under the skin. These injections are given by a health care professional. You must not be pregnant before getting an injection. The injection is usually given during the first 5 days after the start of a menstrual period or 6 weeks after delivery of a baby. Talk to your pediatrician regarding the use of this medicine in children. Special care may be needed. These injections have been used in female children who have started having menstrual periods. Overdosage: If you think you have taken too much of this medicine contact a poison control center or emergency room at once. NOTE: This medicine is only for you. Do not share this medicine with others. What if I miss a dose? Try not to miss a dose. You must get an injection once every 3 months to maintain birth control. If you cannot keep an appointment, call and reschedule it. If you wait longer than 13 weeks between Depo-Provera contraceptive injections or longer than 14 weeks between Depo-subQ Provera 104 injections, you could get pregnant. Use another method for birth control if you miss your appointment. You may also need a pregnancy test before receiving another injection. What may interact with this medicine? Do not take this medicine with any of the following medications:  bosentan This medicine may also interact with the following medications:  aminoglutethimide  antibiotics or medicines for infections, especially rifampin, rifabutin, rifapentine, and  griseofulvin  aprepitant  barbiturate medicines such as phenobarbital or primidone  bexarotene  carbamazepine  medicines for seizures like ethotoin, felbamate, oxcarbazepine, phenytoin, topiramate  modafinil  St. John's wort This list may not describe all possible interactions. Give your health care provider a list of all the medicines, herbs, non-prescription drugs, or dietary supplements you use. Also tell them if you smoke, drink alcohol, or use illegal drugs. Some items may interact with your medicine. What should I watch for while using this medicine? This drug does not protect you against HIV infection (AIDS) or other sexually transmitted diseases. Use of this product may cause you to lose calcium from your bones. Loss of calcium may cause weak bones (osteoporosis). Only use this product for more than 2 years if other forms of birth control are not right for you. The longer you use this product for birth control the more likely you will be at risk for weak bones. Ask your health care professional how you can keep strong bones. You may have a change in bleeding pattern or irregular periods. Many females stop having periods while taking this drug. If you have received your injections on time, your chance of being pregnant is very low. If you think you may be pregnant, see your health care professional as soon as possible. Tell your health care professional if you want to get pregnant within the next year. The effect of this medicine may last a long time after you get your last injection. What side effects may I notice from receiving this medicine? Side effects that you should report to your doctor or health care professional as soon as possible:  allergic reactions like skin rash, itching or hives, swelling of the face, lips, or tongue  breast tenderness or discharge  breathing problems  changes in vision  depression  feeling faint or lightheaded, falls  fever  pain in the  abdomen, chest, groin, or leg  problems with balance, talking, walking  unusually weak or tired  yellowing of the  eyes or skin Side effects that usually do not require medical attention (report to your doctor or health care professional if they continue or are bothersome):  acne  fluid retention and swelling  headache  irregular periods, spotting, or absent periods  temporary pain, itching, or skin reaction at site where injected  weight gain This list may not describe all possible side effects. Call your doctor for medical advice about side effects. You may report side effects to FDA at 1-800-FDA-1088. Where should I keep my medicine? This does not apply. The injection will be given to you by a health care professional. NOTE: This sheet is a summary. It may not cover all possible information. If you have questions about this medicine, talk to your doctor, pharmacist, or health care provider.  2020 Elsevier/Gold Standard (2008-01-13 18:37:56)

## 2019-07-28 LAB — CBC
Hematocrit: 28.9 % — ABNORMAL LOW (ref 34.0–46.6)
Hemoglobin: 8.7 g/dL — ABNORMAL LOW (ref 11.1–15.9)
MCH: 22.4 pg — ABNORMAL LOW (ref 26.6–33.0)
MCHC: 30.1 g/dL — ABNORMAL LOW (ref 31.5–35.7)
MCV: 74 fL — ABNORMAL LOW (ref 79–97)
Platelets: 273 10*3/uL (ref 150–450)
RBC: 3.89 x10E6/uL (ref 3.77–5.28)
RDW: 14.5 % (ref 11.7–15.4)
WBC: 7.5 10*3/uL (ref 3.4–10.8)

## 2019-07-28 LAB — GLUCOSE TOLERANCE, 2 HOURS W/ 1HR
Glucose, 1 hour: 127 mg/dL (ref 65–179)
Glucose, 2 hour: 67 mg/dL (ref 65–152)
Glucose, Fasting: 76 mg/dL (ref 65–91)

## 2019-07-28 LAB — HIV ANTIBODY (ROUTINE TESTING W REFLEX): HIV Screen 4th Generation wRfx: NONREACTIVE

## 2019-07-28 LAB — RPR: RPR Ser Ql: NONREACTIVE

## 2019-08-01 ENCOUNTER — Encounter: Payer: Self-pay | Admitting: Women's Health

## 2019-08-01 ENCOUNTER — Other Ambulatory Visit: Payer: Self-pay | Admitting: Women's Health

## 2019-08-01 DIAGNOSIS — O99019 Anemia complicating pregnancy, unspecified trimester: Secondary | ICD-10-CM | POA: Insufficient documentation

## 2019-08-01 DIAGNOSIS — O99013 Anemia complicating pregnancy, third trimester: Secondary | ICD-10-CM

## 2019-08-01 MED ORDER — FERROUS SULFATE 325 (65 FE) MG PO TABS: 325.0000 mg | ORAL_TABLET | Freq: Every day | ORAL | 2 refills | Status: DC

## 2019-08-11 ENCOUNTER — Encounter: Payer: Self-pay | Admitting: Medical

## 2019-08-11 ENCOUNTER — Telehealth (INDEPENDENT_AMBULATORY_CARE_PROVIDER_SITE_OTHER): Admitting: Medical

## 2019-08-11 VITALS — BP 113/67 | HR 92 | Wt 131.0 lb

## 2019-08-11 DIAGNOSIS — O26843 Uterine size-date discrepancy, third trimester: Secondary | ICD-10-CM

## 2019-08-11 DIAGNOSIS — O26849 Uterine size-date discrepancy, unspecified trimester: Secondary | ICD-10-CM

## 2019-08-11 DIAGNOSIS — Z3401 Encounter for supervision of normal first pregnancy, first trimester: Secondary | ICD-10-CM

## 2019-08-11 DIAGNOSIS — D563 Thalassemia minor: Secondary | ICD-10-CM

## 2019-08-11 DIAGNOSIS — O99013 Anemia complicating pregnancy, third trimester: Secondary | ICD-10-CM

## 2019-08-11 DIAGNOSIS — D649 Anemia, unspecified: Secondary | ICD-10-CM

## 2019-08-11 DIAGNOSIS — Z3A30 30 weeks gestation of pregnancy: Secondary | ICD-10-CM

## 2019-08-11 NOTE — Patient Instructions (Addendum)
Fetal Movement Counts Patient Name: ________________________________________________ Patient Due Date: ____________________ What is a fetal movement count?  A fetal movement count is the number of times that you feel your baby move during a certain amount of time. This may also be called a fetal kick count. A fetal movement count is recommended for every pregnant woman. You may be asked to start counting fetal movements as early as week 28 of your pregnancy. Pay attention to when your baby is most active. You may notice your baby's sleep and wake cycles. You may also notice things that make your baby move more. You should do a fetal movement count:  When your baby is normally most active.  At the same time each day. A good time to count movements is while you are resting, after having something to eat and drink. How do I count fetal movements? 1. Find a quiet, comfortable area. Sit, or lie down on your side. 2. Write down the date, the start time and stop time, and the number of movements that you felt between those two times. Take this information with you to your health care visits. 3. Write down your start time when you feel the first movement. 4. Count kicks, flutters, swishes, rolls, and jabs. You should feel at least 10 movements. 5. You may stop counting after you have felt 10 movements, or if you have been counting for 2 hours. Write down the stop time. 6. If you do not feel 10 movements in 2 hours, contact your health care provider for further instructions. Your health care provider may want to do additional tests to assess your baby's well-being. Contact a health care provider if:  You feel fewer than 10 movements in 2 hours.  Your baby is not moving like he or she usually does. Date: ____________ Start time: ____________ Stop time: ____________ Movements: ____________ Date: ____________ Start time: ____________ Stop time: ____________ Movements: ____________ Date: ____________  Start time: ____________ Stop time: ____________ Movements: ____________ Date: ____________ Start time: ____________ Stop time: ____________ Movements: ____________ Date: ____________ Start time: ____________ Stop time: ____________ Movements: ____________ Date: ____________ Start time: ____________ Stop time: ____________ Movements: ____________ Date: ____________ Start time: ____________ Stop time: ____________ Movements: ____________ Date: ____________ Start time: ____________ Stop time: ____________ Movements: ____________ Date: ____________ Start time: ____________ Stop time: ____________ Movements: ____________ This information is not intended to replace advice given to you by your health care provider. Make sure you discuss any questions you have with your health care provider. Document Revised: 08/11/2018 Document Reviewed: 08/11/2018 Elsevier Patient Education  2020 [employer] of the uterus can occur throughout pregnancy, but they are not always a sign that you are in labor. You may have practice contractions called Braxton Hicks contractions. These false labor contractions are sometimes confused with true labor. What are [place] contractions? Braxton Hicks contractions are tightening movements that occur in the muscles of the uterus before labor. Unlike true labor contractions, these contractions do not result in opening (dilation) and thinning of the cervix. Toward the end of pregnancy (32-34 weeks), Braxton Hicks contractions can happen more often and may become stronger. These contractions are sometimes difficult to tell apart from true labor because they can be very uncomfortable. You should not feel embarrassed if you go to the hospital with false labor. Sometimes, the only way to tell if you are in true labor is for your health care provider to look for changes in the cervix. The health care provider  will do a physical exam and may  monitor your contractions. If you are not in true labor, the exam should show that your cervix is not dilating and your water has not broken. If there are no other health problems associated with your pregnancy, it is completely safe for you to be sent home with false labor. You may continue to have Braxton Hicks contractions until you go into true labor. How to tell the difference between true labor and false labor True labor  Contractions last 30-70 seconds.  Contractions become very regular.  Discomfort is usually felt in the top of the uterus, and it spreads to the lower abdomen and low back.  Contractions do not go away with walking.  Contractions usually become more intense and increase in frequency.  The cervix dilates and gets thinner. False labor  Contractions are usually shorter and not as strong as true labor contractions.  Contractions are usually irregular.  Contractions are often felt in the front of the lower abdomen and in the groin.  Contractions may go away when you walk around or change positions while lying down.  Contractions get weaker and are shorter-lasting as time goes on.  The cervix usually does not dilate or become thin. Follow these instructions at home:   Take over-the-counter and prescription medicines only as told by your health care provider.  Keep up with your usual exercises and follow other instructions from your health care provider.  Eat and drink lightly if you think you are going into labor.  If Braxton Hicks contractions are making you uncomfortable: ? Change your position from lying down or resting to walking, or change from walking to resting. ? Sit and rest in a tub of warm water. ? Drink enough fluid to keep your urine pale yellow. Dehydration may cause these contractions. ? Do slow and deep breathing several times an hour.  Keep all follow-up prenatal visits as told by your health care provider. This is important. Contact a  health care provider if:  You have a fever.  You have continuous pain in your abdomen. Get help right away if:  Your contractions become stronger, more regular, and closer together.  You have fluid leaking or gushing from your vagina.  You pass blood-tinged mucus (bloody show).  You have bleeding from your vagina.  You have low back pain that you never had before.  You feel your baby's head pushing down and causing pelvic pressure.  Your baby is not moving inside you as much as it used to. Summary  Contractions that occur before labor are called Braxton Hicks contractions, false labor, or practice contractions.  Braxton Hicks contractions are usually shorter, weaker, farther apart, and less regular than true labor contractions. True labor contractions usually become progressively stronger and regular, and they become more frequent.  Manage discomfort from [facility] contractions by changing position, resting in a warm bath, drinking plenty of water, or practicing deep breathing. This information is not intended to replace advice given to you by your health care provider. Make sure you discuss any questions you have with your health care provider. Document Revised: 12/04/2016 Document Reviewed: 05/07/2016 Elsevier Patient Education  2020 [employer].   Call about partner testing kit: [place] - phone # [phone]

## 2019-08-11 NOTE — Progress Notes (Signed)
Virtual Visit via Telephone Note  I connected with Cassandra Bradley on 08/11/19 at  9:30 AM EDT by telephone and verified that I am speaking with the correct person using two identifiers.  Pt c/o unable to keep down iron supplements

## 2019-08-11 NOTE — Progress Notes (Signed)
I connected with Cassandra Bradley 08/11/19 at  9:30 AM EDT by: MyChart video and verified that I am speaking with the correct person using two identifiers.  Patient is located at home and provider is located at [facility].     The purpose of this virtual visit is to provide medical care while limiting exposure to the novel coronavirus. I discussed the limitations, risks, security and privacy concerns of performing an evaluation and management service by MyChart video and the availability of in person appointments. I also discussed with the patient that there may be a patient responsible charge related to this service. By engaging in this virtual visit, you consent to the provision of healthcare.  Additionally, you authorize for your insurance to be billed for the services provided during this visit.  The patient expressed understanding and agreed to proceed.  The following staff members participated in the virtual visit:  Lewayne Bunting, CMA    PRENATAL VISIT NOTE  Subjective:  Cassandra Bradley is a 19 y.o. G1P0 at [redacted]w[redacted]d  for phone visit for ongoing prenatal care.  She is currently monitored for the following issues for this low-risk pregnancy and has Encounter for supervision of normal first pregnancy in first trimester; Alpha thalassemia silent carrier; Uterine size date discrepancy pregnancy; and Anemia in pregnancy on their problem list.  Patient reports bump under her arm.  Contractions: Not present. Vag. Bleeding: None.  Movement: Present. Denies leaking of fluid.   The following portions of the patient's history were reviewed and updated as appropriate: allergies, current medications, past family history, past medical history, past social history, past surgical history and problem list.   Objective:   Vitals:   08/11/19 0928  BP: 113/67  Pulse: 92  Weight: 131 lb (59.4 kg)   Self-Obtained  Fetal Status:     Movement: Present     Assessment and Plan:  Pregnancy: G1P0 at [redacted]w[redacted]d 1. Anemia during  pregnancy in third trimester - Patient given iron supplement, she cannot tolerate it in PO form - Will set up Fereheme infusion   2. Encounter for supervision of normal first pregnancy in first trimester - Has Peds list, advised to decide on Peds by 36 weeks  - Normal 2 hour GTT discussed   3. Alpha thalassemia silent carrier - Had Genetic Counseling  - Desires partner testing, will send information for [place]   4. Uterine size date discrepancy pregnancy - Growth [place] 8/12  Preterm labor symptoms and general obstetric precautions including but not limited to vaginal bleeding, contractions, leaking of fluid and fetal movement were reviewed in detail with the patient.  Return in about 2 weeks (around 08/25/2019) for LOB, Virtual.  Future Appointments  Date Time Provider Department Center  08/17/2019  8:45 AM [facility] NURSE [facility]  08/17/2019  8:45 AM [facility] US4 [facility]     Time spent on virtual visit: 15 minutes  Vonzella Nipple, PA-C

## 2019-08-17 ENCOUNTER — Other Ambulatory Visit: Payer: Self-pay

## 2019-08-17 ENCOUNTER — Other Ambulatory Visit: Payer: Self-pay | Admitting: *Deleted

## 2019-08-17 ENCOUNTER — Other Ambulatory Visit: Payer: Self-pay | Admitting: Obstetrics and Gynecology

## 2019-08-17 ENCOUNTER — Ambulatory Visit: Attending: Obstetrics and Gynecology

## 2019-08-17 ENCOUNTER — Ambulatory Visit: Admitting: *Deleted

## 2019-08-17 DIAGNOSIS — O365990 Maternal care for other known or suspected poor fetal growth, unspecified trimester, not applicable or unspecified: Secondary | ICD-10-CM

## 2019-08-17 DIAGNOSIS — Z362 Encounter for other antenatal screening follow-up: Secondary | ICD-10-CM | POA: Insufficient documentation

## 2019-08-17 DIAGNOSIS — Z3401 Encounter for supervision of normal first pregnancy, first trimester: Secondary | ICD-10-CM

## 2019-08-17 DIAGNOSIS — Z3A31 31 weeks gestation of pregnancy: Secondary | ICD-10-CM

## 2019-08-17 DIAGNOSIS — O365930 Maternal care for other known or suspected poor fetal growth, third trimester, not applicable or unspecified: Secondary | ICD-10-CM | POA: Diagnosis present

## 2019-08-17 DIAGNOSIS — O99013 Anemia complicating pregnancy, third trimester: Secondary | ICD-10-CM | POA: Diagnosis present

## 2019-08-17 DIAGNOSIS — Z148 Genetic carrier of other disease: Secondary | ICD-10-CM

## 2019-08-17 DIAGNOSIS — O283 Abnormal ultrasonic finding on antenatal screening of mother: Secondary | ICD-10-CM

## 2019-08-22 NOTE — Discharge Instructions (Signed)

## 2019-08-23 ENCOUNTER — Encounter (HOSPITAL_COMMUNITY)
Admission: RE | Admit: 2019-08-23 | Discharge: 2019-08-23 | Disposition: A | Discharge status: Home / Self Care | Source: Ambulatory Visit | Attending: Family Medicine | Admitting: Family Medicine

## 2019-08-23 ENCOUNTER — Other Ambulatory Visit: Payer: Self-pay

## 2019-08-23 DIAGNOSIS — O99013 Anemia complicating pregnancy, third trimester: Secondary | ICD-10-CM | POA: Insufficient documentation

## 2019-08-23 DIAGNOSIS — D649 Anemia, unspecified: Secondary | ICD-10-CM | POA: Insufficient documentation

## 2019-08-23 DIAGNOSIS — Z3A34 34 weeks gestation of pregnancy: Secondary | ICD-10-CM | POA: Diagnosis not present

## 2019-08-23 MED ORDER — SODIUM CHLORIDE 0.9 % IV SOLN
510.0000 mg | INTRAVENOUS | Status: DC
  Administered 2019-08-23: 510 mg via INTRAVENOUS
  Filled 2019-08-23: qty 17

## 2019-08-24 ENCOUNTER — Ambulatory Visit: Admitting: *Deleted

## 2019-08-24 ENCOUNTER — Ambulatory Visit: Attending: Obstetrics and Gynecology

## 2019-08-24 ENCOUNTER — Other Ambulatory Visit: Payer: Self-pay

## 2019-08-24 ENCOUNTER — Encounter: Payer: Self-pay | Admitting: *Deleted

## 2019-08-24 DIAGNOSIS — Z3401 Encounter for supervision of normal first pregnancy, first trimester: Secondary | ICD-10-CM | POA: Insufficient documentation

## 2019-08-24 DIAGNOSIS — O365930 Maternal care for other known or suspected poor fetal growth, third trimester, not applicable or unspecified: Secondary | ICD-10-CM | POA: Diagnosis not present

## 2019-08-24 DIAGNOSIS — O283 Abnormal ultrasonic finding on antenatal screening of mother: Secondary | ICD-10-CM | POA: Diagnosis not present

## 2019-08-24 DIAGNOSIS — Z148 Genetic carrier of other disease: Secondary | ICD-10-CM | POA: Diagnosis not present

## 2019-08-24 DIAGNOSIS — Z362 Encounter for other antenatal screening follow-up: Secondary | ICD-10-CM

## 2019-08-24 DIAGNOSIS — O99013 Anemia complicating pregnancy, third trimester: Secondary | ICD-10-CM | POA: Insufficient documentation

## 2019-08-24 DIAGNOSIS — O365990 Maternal care for other known or suspected poor fetal growth, unspecified trimester, not applicable or unspecified: Secondary | ICD-10-CM | POA: Insufficient documentation

## 2019-08-24 DIAGNOSIS — Z3A32 32 weeks gestation of pregnancy: Secondary | ICD-10-CM

## 2019-08-25 ENCOUNTER — Telehealth (INDEPENDENT_AMBULATORY_CARE_PROVIDER_SITE_OTHER): Admitting: Obstetrics

## 2019-08-25 ENCOUNTER — Encounter: Payer: Self-pay | Admitting: Obstetrics

## 2019-08-25 VITALS — BP 111/56 | HR 94

## 2019-08-25 DIAGNOSIS — O365930 Maternal care for other known or suspected poor fetal growth, third trimester, not applicable or unspecified: Secondary | ICD-10-CM

## 2019-08-25 DIAGNOSIS — D649 Anemia, unspecified: Secondary | ICD-10-CM

## 2019-08-25 DIAGNOSIS — Z3A32 32 weeks gestation of pregnancy: Secondary | ICD-10-CM

## 2019-08-25 DIAGNOSIS — Z3401 Encounter for supervision of normal first pregnancy, first trimester: Secondary | ICD-10-CM

## 2019-08-25 DIAGNOSIS — O99013 Anemia complicating pregnancy, third trimester: Secondary | ICD-10-CM

## 2019-08-25 NOTE — Progress Notes (Signed)
OBSTETRICS PRENATAL VIRTUAL VISIT ENCOUNTER NOTE  Provider location: Center for [facility]   I connected with Cassandra Bradley on 08/25/19 at  8:30 AM EDT by MyChart Video Encounter at home and verified that I am speaking with the correct person using two identifiers.   I discussed the limitations, risks, security and privacy concerns of performing an evaluation and management service virtually and the availability of in person appointments. I also discussed with the patient that there may be a patient responsible charge related to this service. The patient expressed understanding and agreed to proceed. Subjective:  Cassandra Bradley is a 19 y.o. G1P0 at [redacted]w[redacted]d being seen today for ongoing prenatal care.  She is currently monitored for the following issues for this high-risk pregnancy and has Encounter for supervision of normal first pregnancy in first trimester; Alpha thalassemia silent carrier; Uterine size date discrepancy pregnancy; and Anemia in pregnancy on their problem list.  Patient reports no complaints.  Contractions: Not present. Vag. Bleeding: None.  Movement: Present. Denies any leaking of fluid.   The following portions of the patient's history were reviewed and updated as appropriate: allergies, current medications, past family history, past medical history, past social history, past surgical history and problem list.   Objective:   Vitals:   08/25/19 0827  BP: (!) 111/56  Pulse: 94    Fetal Status:     Movement: Present     General:  Alert, oriented and cooperative. Patient is in no acute distress.  Respiratory: Normal respiratory effort, no problems with respiration noted  Mental Status: Normal mood and affect. Normal behavior. Normal judgment and thought content.  Rest of physical exam deferred due to type of encounter  Imaging: [place] MFM FETAL BPP WO NON STRESS  Result Date: 08/24/2019 ----------------------------------------------------------------------   OBSTETRICS REPORT                       (Signed Final 08/24/2019 12:18 pm) ---------------------------------------------------------------------- Patient Info  ID #:       [id number]                          D.O.B.:  [birth year] (19 yrs)  Name:       Cassandra Bradley                     Visit Date: 08/24/2019 10:16 am ---------------------------------------------------------------------- Performed By  Attending:        Ma Rings MD         Ref. Address:     Faculty  Performed By:     Tommie Raymond BS,       [facility]                    RD[facility], RVT                                [facility]                                                             [facility]  Referred By:      Catalina Antigua MD ---------------------------------------------------------------------- Orders  #  Description                           Code        Ordered By  1  [place] MFM FETAL BPP WO NON               76819.01    RAVI SHANKAR     STRESS  2  [place] MFM UA CORD DOPPLER                76820.02    RAVI [facility] ----------------------------------------------------------------------  #  Order #                     Accession #                Episode #  1  [id number]                   [id number]                 [id number]  2  [id number]                   [id number]                 [id number] ---------------------------------------------------------------------- Indications  Maternal care for known or suspected poor      O36.5930  fetal growth, third trimester, not applicable or  unspecified IUGR  [redacted] weeks gestation of pregnancy                Z3A.32  Pyelectasis of fetus on prenatal ultrasound    O28.3  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  Low Risk NIPS (Negative AFP)  Encounter for other antenatal screening        Z36.2  follow-up ---------------------------------------------------------------------- Fetal Evaluation  Num Of Fetuses:          1  Fetal Heart Rate(bpm):  136  Cardiac Activity:       Observed  Placenta:               Anterior  P. Cord Insertion:      Previously Visualized  AFI Sum(cm)     %Tile       Largest Pocket(cm)  21.95           85          9.71  RUQ(cm)       RLQ(cm)       LUQ(cm)        LLQ(cm)  9.71          4.03          2.55           5.66 ---------------------------------------------------------------------- Biophysical Evaluation  Amniotic F.V:   Within normal limits       F. Tone:        Observed  F. Movement:    Observed                   Score:          8/8  F. Breathing:   Observed ---------------------------------------------------------------------- OB History  Gravidity:    1 ---------------------------------------------------------------------- Gestational Age  LMP:  30w 6d        Date:  01/20/19                 EDD:   10/27/19  Best:          Armida Sans 0d     Det. ByMarcella Dubs         EDD:   10/19/19                                      (02/26/19) ---------------------------------------------------------------------- Anatomy  Cranium:               Previously seen        LVOT:                   Previously seen  Cavum:                 Previously seen        Aortic Arch:            Previously seen  Ventricles:            Previously seen        Ductal Arch:            Previously seen  Choroid Plexus:        Previously seen        Diaphragm:              Previously seen  Cerebellum:            Previously seen        Stomach:                Appears normal, left                                                                        sided  Posterior Fossa:       Previously seen        Abdomen:                Previously seen  Nuchal Fold:           Previously seen        Abdominal Wall:         Previously seen  Face:                  Orbits and profile     Cord Vessels:           Previously seen                         previously seen  Lips:                  Previously seen        Kidneys:                 Bilateral UTD prev.  Palate:                Previously seen        Bladder:  Appears normal  Thoracic:              Previously seen        Spine:                  Limited views                                                                        previously seen  Heart:                 Appears normal         Upper Extremities:      Previously seen                         (4CH, axis, and                         situs)  RVOT:                  Previously seen        Lower Extremities:      Previously seen ---------------------------------------------------------------------- Doppler - Fetal Vessels  Umbilical Artery   S/D     %tile                                              ADFV    RDFV   3.56       90                                                 No      No ---------------------------------------------------------------------- Cervix Uterus Adnexa  Cervix  Normal appearance by transabdominal scan.  Uterus  No abnormality visualized.  Right Ovary  Within normal limits. No adnexal mass visualized.  Left Ovary  Within normal limits. No adnexal mass visualized.  Cul De Sac  No free fluid seen.  Adnexa  No abnormality visualized. ---------------------------------------------------------------------- Comments  This patient was seen due to an IUGR fetus.  She denies any  problems since her last exam.  A biophysical profile performed today was 8 out of 8.  There was normal amniotic fluid noted on today's ultrasound  exam.  Doppler studies of the umbilical arteries performed due to  fetal growth restriction showed a normal S/D ratio of 3.56.  There were no signs of absent or reversed end-diastolic flow  noted today.  Mild bilateral pyelectasis (0.6 cm) continues to be noted  today.  A follow-up exam was scheduled in 1 week. ----------------------------------------------------------------------                   Ma RingsVictor Fang, MD Electronically Signed Final Report   08/24/2019 12:18 pm  ----------------------------------------------------------------------  US MFM OB FOLLOW UP  Result Date: 08/17/2019 ----------------------------------------------------------------------  OBSTETRICS REPORT                       (  Signed Final 08/17/2019 10:11 am) ---------------------------------------------------------------------- Patient Info  ID #:       [id number]                          D.O.B.:  01/02/2001 (19 yrs)  Name:       Cassandra Bradley                     Visit Date: 08/17/2019 08:57 am ---------------------------------------------------------------------- Performed By  Attending:        Noralee Space MD        Ref. Address:     Faculty  Performed By:     Truitt Leep,      [facility]                    RDMS,RDCS                                [facility]  Referred By:      Catalina Antigua MD ---------------------------------------------------------------------- Orders  #  Description                           Code        Ordered By  1  [place] MFM OB FOLLOW UP                   21308.65    RAVI SHANKAR  2  [place] MFM UA CORD DOPPLER                78469.62    RAVI [facility] ----------------------------------------------------------------------  #  Order #                     Accession #                Episode #  1  [id number]                   [id number]                 [id number]  2  [id number]                   [id number]                 [id number] ---------------------------------------------------------------------- Indications  Pyelectasis of fetus on prenatal ultrasound    O28.3  Maternal care for known or suspected poor      O36.5930  fetal growth, third trimester, not applicable or  unspecified IUGR  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  Low Risk NIPS (Negative AFP)  Antenatal follow-up for nonvisualized  fetal    Z36.2  anatomy  [redacted] weeks gestation of pregnancy                Z3A.31 ---------------------------------------------------------------------- Fetal Evaluation  Num Of Fetuses:         1  Cardiac Activity:       Observed  Presentation:           Cephalic  Placenta:               Anterior  P. Cord Insertion:      Visualized, central  Amniotic Fluid  AFI FV:      Within normal limits  AFI Sum(cm)     %Tile       Largest Pocket(cm)  21.1            82          6.22  RUQ(cm)       RLQ(cm)       LUQ(cm)        LLQ(cm)  5.38          4.26          5.24           6.22 ---------------------------------------------------------------------- Biometry  BPD:      77.6  mm     G. Age:  31w 1d         43  %    CI:        77.12   %    70 - 86                                                          FL/HC:      20.5   %    19.3 - 21.3  HC:      279.8  mm     G. Age:  30w 4d         10  %    HC/AC:      1.12        0.96 - 1.17  AC:      249.7  mm     G. Age:  29w 1d          6  %    FL/BPD:     74.0   %    71 - 87  FL:       57.4  mm     G. Age:  30w 1d         14  %    FL/AC:      23.0   %    20 - 24  LV:        4.6  mm  Est. FW:    1449  gm      3 lb 3 oz      9  % ---------------------------------------------------------------------- OB History  Gravidity:    1 ---------------------------------------------------------------------- Gestational Age  LMP:           29w 6d        Date:  01/20/19  EDD:   10/27/19  U/S Today:     30w 2d                                        EDD:   10/24/19  Best:          31w 0d     Det. ByMarcella Dubs         EDD:   10/19/19                                      (02/26/19) ---------------------------------------------------------------------- Anatomy  Cranium:               Appears normal         Palate:                 Appears normal  Cavum:                 Appears normal         Heart:                  Appears normal                                                                         (4CH, axis, and                                                                        situs)  Ventricles:            Appears normal         Stomach:                Appears normal, left                                                                        sided  Choroid Plexus:        Appears normal         Kidneys:                Bilateral UTD (8                                                                        mm each)  Cerebellum:  Appears normal         Bladder:                Appears normal  Posterior Fossa:       Appears normal  Other:  Other anatomy previously imaged and appeared normal. ---------------------------------------------------------------------- Doppler - Fetal Vessels  Umbilical Artery   S/D     %tile      RI    %tile   2.72       48    0.63       50 ---------------------------------------------------------------------- Cervix Uterus Adnexa  Cervix  Normal appearance by transabdominal scan. ---------------------------------------------------------------------- Impression  Patient return for fetal growth and renal assessments.  On  previous ultrasound bilateral mild urinary tract dilations were  seen.  She does not have gestational diabetes.  On cell free  fetal DNA screening, the risks of fetal aneuploidies are not  increased.  On ultrasound, the estimated fetal weight is at the 9th  percentile.  Abdominal circumference measurement is at the  6 percentile.  Amniotic fluid is normal and good fetal activity  seen.  Umbilical artery Doppler showed normal forward  diastolic flow.  Bilateral urinary tract dilations each measuring  9 mm are seen.  Both kidneys, otherwise, appear normal with  no increased echogenicities.  I explained the findings consistent with fetal growth  restriction.  I explained that fetal growth restriction is difficult  to differentiate from a constitutionally small fetus.  I explained  ultrasound protocol of antenatal testing.  Blood pressure  today at her office is 114/69 mmHg. ---------------------------------------------------------------------- Recommendations  -Continue weekly BPP and UA Doppler studies.  -Fetal growth assessment in 3 weeks. ----------------------------------------------------------------------                  Noralee Space, MD Electronically Signed Final Report   08/17/2019 10:11 am ----------------------------------------------------------------------  [place] MFM UA CORD DOPPLER  Result Date: 08/24/2019 ----------------------------------------------------------------------  OBSTETRICS REPORT                       (Signed Final 08/24/2019 12:18 pm) ---------------------------------------------------------------------- Patient Info  ID #:       [id number]                          D.O.B.:  2000/08/04 (19 yrs)  Name:       Cassandra Bradley                     Visit Date: 08/24/2019 10:16 am ---------------------------------------------------------------------- Performed By  Attending:        Ma Rings MD         Ref. Address:     Faculty  Performed By:     Tommie Raymond BS,       [facility]                    RDMS, RVT                                [facility]  Referred By:      Gigi Gin                    CONSTANT MD ---------------------------------------------------------------------- Orders  #  Description                           Code        Ordered By  1  [place] MFM FETAL BPP WO NON               76819.01    RAVI SHANKAR     STRESS  2  [place] MFM UA CORD DOPPLER                76820.02    RAVI [facility] ----------------------------------------------------------------------  #  Order #                     Accession #                Episode #  1  [id number]                   [id number]                 [id number]  2  [id number]                   [id number]                 [id number]  ---------------------------------------------------------------------- Indications  Maternal care for known or suspected poor      O36.5930  fetal growth, third trimester, not applicable or  unspecified IUGR  [redacted] weeks gestation of pregnancy                Z3A.32  Pyelectasis of fetus on prenatal ultrasound    O28.3  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 Cord Insertion:      Previously Visualized  AFI Sum(cm)     %Tile       Largest Pocket(cm)  21.95           85          9.71  RUQ(cm)       RLQ(cm)       LUQ(cm)        LLQ(cm)  9.71          4.03          2.55           5.66 ---------------------------------------------------------------------- Biophysical Evaluation  Amniotic F.V:   Within normal limits       F. Tone:        Observed  F. Movement:  Observed                   Score:          8/8  F. Breathing:   Observed ---------------------------------------------------------------------- OB History  Gravidity:    1 ---------------------------------------------------------------------- Gestational Age  LMP:           30w 6d        Date:  01/20/19                 EDD:   10/27/19  Best:          Armida Sans 0d     Det. ByMarcella Dubs         EDD:   10/19/19                                      (02/26/19) ---------------------------------------------------------------------- Anatomy  Cranium:               Previously seen        LVOT:                   Previously seen  Cavum:                 Previously seen        Aortic Arch:            Previously seen  Ventricles:            Previously seen        Ductal Arch:            Previously seen  Choroid Plexus:        Previously seen        Diaphragm:               Previously seen  Cerebellum:            Previously seen        Stomach:                Appears normal, left                                                                        sided  Posterior Fossa:       Previously seen        Abdomen:                Previously seen  Nuchal Fold:           Previously seen        Abdominal Wall:         Previously seen  Face:                  Orbits and profile     Cord Vessels:           Previously seen                         previously seen  Lips:                  Previously seen  Kidneys:                Bilateral UTD prev.  Palate:                Previously seen        Bladder:                Appears normal  Thoracic:              Previously seen        Spine:                  Limited views                                                                        previously seen  Heart:                 Appears normal         Upper Extremities:      Previously seen                         (4CH, axis, and                         situs)  RVOT:                  Previously seen        Lower Extremities:      Previously seen ---------------------------------------------------------------------- Doppler - Fetal Vessels  Umbilical Artery   S/D     %tile                                              ADFV    RDFV   3.56       90                                                 No      No ---------------------------------------------------------------------- Cervix Uterus Adnexa  Cervix  Normal appearance by transabdominal scan.  Uterus  No abnormality visualized.  Right Ovary  Within normal limits. No adnexal mass visualized.  Left Ovary  Within normal limits. No adnexal mass visualized.  Cul De Sac  No free fluid seen.  Adnexa  No abnormality visualized. ---------------------------------------------------------------------- Comments  This patient was seen due to an IUGR fetus.  She denies any  problems since her last exam.  A biophysical profile performed today was 8 out  of 8.  There was normal amniotic fluid noted on today's ultrasound  exam.  Doppler studies of the umbilical arteries performed due to  fetal growth restriction showed a normal S/D ratio of 3.56.  There were no signs of absent or reversed end-diastolic flow  noted today.  Mild bilateral pyelectasis (0.6 cm) continues to be noted  today.  A follow-up exam was scheduled in 1 week. ----------------------------------------------------------------------  Ma Rings, MD Electronically Signed Final Report   08/24/2019 12:18 pm ----------------------------------------------------------------------  [place] MFM UA CORD DOPPLER  Result Date: 08/17/2019 ----------------------------------------------------------------------  OBSTETRICS REPORT                       (Signed Final 08/17/2019 10:11 am) ---------------------------------------------------------------------- Patient Info  ID #:       [id number]                          D.O.B.:  [birth year] (19 yrs)  Name:       Cassandra Bradley                     Visit Date: 08/17/2019 08:57 am ---------------------------------------------------------------------- Performed By  Attending:        Noralee Space MD        Ref. Address:     Faculty  Performed By:     Truitt Leep,      [facility]                    RDMS[facility]RDCS                                [facility]  Referred By:      Catalina Antigua MD ---------------------------------------------------------------------- Orders  #  Description                           Code        Ordered By  1  [place] MFM OB FOLLOW UP                   40981.19    RAVI SHANKAR  2  [place] MFM UA CORD DOPPLER                14782.95    RAVI [facility] ----------------------------------------------------------------------  #  Order #                     Accession #                 Episode #  1  [id number]                   [id number]                 [id number]  2  [id number]  [id number]                 [id number] ---------------------------------------------------------------------- Indications  Pyelectasis of fetus on prenatal ultrasound    O28.3  Maternal care for known or suspected poor      O36.5930  fetal growth, third trimester, not applicable or  unspecified IUGR  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  Low Risk NIPS (Negative AFP)  Antenatal follow-up for nonvisualized fetal    Z36.2  anatomy  [redacted] weeks gestation of pregnancy                Z3A.31 ---------------------------------------------------------------------- Fetal Evaluation  Num Of Fetuses:         1  Cardiac Activity:       Observed  Presentation:           Cephalic  Placenta:               Anterior  P. Cord Insertion:      Visualized, central  Amniotic Fluid  AFI FV:      Within normal limits  AFI Sum(cm)     %Tile       Largest Pocket(cm)  21.1            82          6.22  RUQ(cm)       RLQ(cm)       LUQ(cm)        LLQ(cm)  5.38          4.26          5.24           6.22 ---------------------------------------------------------------------- Biometry  BPD:      77.6  mm     G. Age:  31w 1d         43  %    CI:        77.12   %    70 - 86                                                          FL/HC:      20.5   %    19.3 - 21.3  HC:      279.8  mm     G. Age:  30w 4d         10  %    HC/AC:      1.12        0.96 - 1.17  AC:      249.7  mm     G. Age:  29w 1d          6  %    FL/BPD:     74.0   %    71 - 87  FL:       57.4  mm     G. Age:  30w 1d         14  %    FL/AC:      23.0   %    20 - 24  LV:        4.6  mm  Est. FW:    1449  gm      3 lb 3 oz      9  % ---------------------------------------------------------------------- OB History  Gravidity:  1 ---------------------------------------------------------------------- Gestational Age  LMP:           29w 6d        Date:  01/20/19                  EDD:   10/27/19  U/S Today:     30w 2d                                        EDD:   10/24/19  Best:          31w 0d     Det. ByMarcella Dubs         EDD:   10/19/19                                      (02/26/19) ---------------------------------------------------------------------- Anatomy  Cranium:               Appears normal         Palate:                 Appears normal  Cavum:                 Appears normal         Heart:                  Appears normal                                                                        (4CH, axis, and                                                                        situs)  Ventricles:            Appears normal         Stomach:                Appears normal, left                                                                        sided  Choroid Plexus:        Appears normal         Kidneys:                Bilateral UTD (8  mm each)  Cerebellum:            Appears normal         Bladder:                Appears normal  Posterior Fossa:       Appears normal  Other:  Other anatomy previously imaged and appeared normal. ---------------------------------------------------------------------- Doppler - Fetal Vessels  Umbilical Artery   S/D     %tile      RI    %tile   2.72       48    0.63       50 ---------------------------------------------------------------------- Cervix Uterus Adnexa  Cervix  Normal appearance by transabdominal scan. ---------------------------------------------------------------------- Impression  Patient return for fetal growth and renal assessments.  On  previous ultrasound bilateral mild urinary tract dilations were  seen.  She does not have gestational diabetes.  On cell free  fetal DNA screening, the risks of fetal aneuploidies are not  increased.  On ultrasound, the estimated fetal weight is at the 9th  percentile.  Abdominal circumference measurement is  at the  6 percentile.  Amniotic fluid is normal and good fetal activity  seen.  Umbilical artery Doppler showed normal forward  diastolic flow.  Bilateral urinary tract dilations each measuring  9 mm are seen.  Both kidneys, otherwise, appear normal with  no increased echogenicities.  I explained the findings consistent with fetal growth  restriction.  I explained that fetal growth restriction is difficult  to differentiate from a constitutionally small fetus.  I explained  ultrasound protocol of antenatal testing.  Blood pressure today at her office is 114/69 mmHg. ---------------------------------------------------------------------- Recommendations  -Continue weekly BPP and UA Doppler studies.  -Fetal growth assessment in 3 weeks. ----------------------------------------------------------------------                  Noralee Space, MD Electronically Signed Final Report   08/17/2019 10:11 am ----------------------------------------------------------------------   Assessment and Plan:  Pregnancy: G1P0 at [redacted]w[redacted]d 1. Encounter for supervision of normal first pregnancy in first trimester  2. Poor fetal growth affecting management of mother in third trimester, single or unspecified fetus - weekly BPP with Dopplers  3. Anemia during pregnancy in third trimester   Preterm labor symptoms and general obstetric precautions including but not limited to vaginal bleeding, contractions, leaking of fluid and fetal movement were reviewed in detail with the patient. I discussed the assessment and treatment plan with the patient. The patient was provided an opportunity to ask questions and all were answered. The patient agreed with the plan and demonstrated an understanding of the instructions. The patient was advised to call back or seek an in-person office evaluation/go to MAU at [facility] for any urgent or concerning symptoms. Please refer to After Visit Summary for other counseling recommendations.    I provided 10 minutes of face-to-face time during this encounter.  Return in about 2 weeks (around 09/08/2019) for MyChart.  Future Appointments  Date Time Provider Department Center  08/30/2019  9:00 AM [facility] None  08/31/2019  8:30 AM [facility] NURSE [facility]  08/31/2019  8:45 AM [facility] US4 [facility]  09/07/2019  1:00 PM [facility] NURSE [facility]  09/07/2019  1:15 PM [facility] US2 [facility]    Coral Ceo, MD [facility], [facility] 08/25/19

## 2019-08-30 ENCOUNTER — Encounter (HOSPITAL_COMMUNITY)
Admission: RE | Admit: 2019-08-30 | Discharge: 2019-08-30 | Disposition: A | Discharge status: Home / Self Care | Source: Ambulatory Visit | Attending: Family Medicine | Admitting: Family Medicine

## 2019-08-30 ENCOUNTER — Other Ambulatory Visit: Payer: Self-pay

## 2019-08-30 DIAGNOSIS — O99013 Anemia complicating pregnancy, third trimester: Secondary | ICD-10-CM | POA: Diagnosis not present

## 2019-08-30 MED ORDER — SODIUM CHLORIDE 0.9 % IV SOLN
510.0000 mg | INTRAVENOUS | Status: DC
  Administered 2019-08-30: 510 mg via INTRAVENOUS
  Filled 2019-08-30: qty 17

## 2019-08-31 ENCOUNTER — Ambulatory Visit: Attending: Obstetrics and Gynecology

## 2019-08-31 ENCOUNTER — Other Ambulatory Visit: Payer: Self-pay | Admitting: *Deleted

## 2019-08-31 ENCOUNTER — Ambulatory Visit: Admitting: *Deleted

## 2019-08-31 DIAGNOSIS — Z362 Encounter for other antenatal screening follow-up: Secondary | ICD-10-CM

## 2019-08-31 DIAGNOSIS — O99013 Anemia complicating pregnancy, third trimester: Secondary | ICD-10-CM | POA: Diagnosis present

## 2019-08-31 DIAGNOSIS — O365990 Maternal care for other known or suspected poor fetal growth, unspecified trimester, not applicable or unspecified: Secondary | ICD-10-CM | POA: Diagnosis present

## 2019-08-31 DIAGNOSIS — Z148 Genetic carrier of other disease: Secondary | ICD-10-CM | POA: Diagnosis not present

## 2019-08-31 DIAGNOSIS — O283 Abnormal ultrasonic finding on antenatal screening of mother: Secondary | ICD-10-CM | POA: Diagnosis not present

## 2019-08-31 DIAGNOSIS — Z3401 Encounter for supervision of normal first pregnancy, first trimester: Secondary | ICD-10-CM | POA: Insufficient documentation

## 2019-08-31 DIAGNOSIS — Z3A33 33 weeks gestation of pregnancy: Secondary | ICD-10-CM

## 2019-08-31 DIAGNOSIS — O365930 Maternal care for other known or suspected poor fetal growth, third trimester, not applicable or unspecified: Secondary | ICD-10-CM | POA: Diagnosis not present

## 2019-09-07 ENCOUNTER — Other Ambulatory Visit: Payer: Self-pay

## 2019-09-07 ENCOUNTER — Ambulatory Visit: Admitting: *Deleted

## 2019-09-07 ENCOUNTER — Ambulatory Visit: Attending: Obstetrics and Gynecology

## 2019-09-07 DIAGNOSIS — O99013 Anemia complicating pregnancy, third trimester: Secondary | ICD-10-CM

## 2019-09-07 DIAGNOSIS — Z148 Genetic carrier of other disease: Secondary | ICD-10-CM

## 2019-09-07 DIAGNOSIS — O358XX0 Maternal care for other (suspected) fetal abnormality and damage, not applicable or unspecified: Secondary | ICD-10-CM | POA: Diagnosis not present

## 2019-09-07 DIAGNOSIS — Z3401 Encounter for supervision of normal first pregnancy, first trimester: Secondary | ICD-10-CM | POA: Diagnosis present

## 2019-09-07 DIAGNOSIS — O283 Abnormal ultrasonic finding on antenatal screening of mother: Secondary | ICD-10-CM | POA: Diagnosis not present

## 2019-09-07 DIAGNOSIS — Z3A34 34 weeks gestation of pregnancy: Secondary | ICD-10-CM

## 2019-09-07 DIAGNOSIS — O365930 Maternal care for other known or suspected poor fetal growth, third trimester, not applicable or unspecified: Secondary | ICD-10-CM

## 2019-09-07 DIAGNOSIS — O365990 Maternal care for other known or suspected poor fetal growth, unspecified trimester, not applicable or unspecified: Secondary | ICD-10-CM

## 2019-09-07 DIAGNOSIS — Z362 Encounter for other antenatal screening follow-up: Secondary | ICD-10-CM

## 2019-09-08 ENCOUNTER — Encounter: Payer: Self-pay | Admitting: Obstetrics

## 2019-09-08 ENCOUNTER — Telehealth (INDEPENDENT_AMBULATORY_CARE_PROVIDER_SITE_OTHER): Admitting: Obstetrics

## 2019-09-08 VITALS — BP 110/69 | HR 94

## 2019-09-08 DIAGNOSIS — D649 Anemia, unspecified: Secondary | ICD-10-CM

## 2019-09-08 DIAGNOSIS — O99013 Anemia complicating pregnancy, third trimester: Secondary | ICD-10-CM

## 2019-09-08 DIAGNOSIS — Z3401 Encounter for supervision of normal first pregnancy, first trimester: Secondary | ICD-10-CM

## 2019-09-08 DIAGNOSIS — Z3A34 34 weeks gestation of pregnancy: Secondary | ICD-10-CM

## 2019-09-08 NOTE — Progress Notes (Signed)
Virtual ROB  Patient was able to check B/P while on the phone.   CC: None

## 2019-09-08 NOTE — Progress Notes (Signed)
OBSTETRICS PRENATAL VIRTUAL VISIT ENCOUNTER NOTE  [facility]   I connected with Cassandra KehrMakia Cadle on 09/08/19 at  8:30 AM EDT by MyChart Video Encounter at home and verified that I am speaking with the correct person using two identifiers.   I discussed the limitations, risks, security and privacy concerns of performing an evaluation and management service virtually and the availability of in person appointments. I also discussed with the patient that there may be a patient responsible charge related to this service. The patient expressed understanding and agreed to proceed. Subjective:  Cassandra KehrMakia Bradley is a 19 y.o. G1P0 at 5231w1d being seen today for ongoing prenatal care.  She is currently monitored for the following issues for this low-risk pregnancy and has Encounter for supervision of normal first pregnancy in first trimester; Alpha thalassemia silent carrier; Uterine size date discrepancy pregnancy; and Anemia in pregnancy on their problem list.  Patient reports no complaints.  Contractions: Not present. Vag. Bleeding: None.  Movement: Present. Denies any leaking of fluid.   The following portions of the patient's history were reviewed and updated as appropriate: allergies, current medications, past family history, past medical history, past social history, past surgical history and problem list.   Objective:   Vitals:   09/08/19 0824  BP: 110/69  Pulse: 94    Fetal Status:     Movement: Present     General:  Alert, oriented and cooperative. Patient is in no acute distress.  Respiratory: Normal respiratory effort, no problems with respiration noted  Mental Status: Normal mood and affect. Normal behavior. Normal judgment and thought content.  Rest of physical exam deferred due to type of encounter  Imaging: US MFM FETAL BPP WO NON STRESS  Result Date: 09/07/2019 ----------------------------------------------------------------------   OBSTETRICS REPORT                       (Signed Final 09/07/2019 02:30 pm) ---------------------------------------------------------------------- Patient Info  ID #:       [id number]                          D.O.B.:  2000/11/05 (19 yrs)  Name:       Cassandra Bradley                     Visit Date: 09/07/2019 01:51 pm ---------------------------------------------------------------------- Performed By  Attending:        Ma RingsVictor Fang MD         Ref. Address:     Faculty  Performed By:     Sandi MealyJovancia Adrien        [facility]                                     [facility]                                                             [facility]  Referred By:      Gigi Gin                    CONSTANT MD ---------------------------------------------------------------------- Orders  #  Description                           Code        Ordered By  1  [place] MFM FETAL BPP WO NON               76819.01    RAVI SHANKAR     STRESS  2  [place] MFM OB FOLLOW UP                   76816.01    RAVI SHANKAR  3  [place] MFM UA CORD DOPPLER                76820.02    RAVI SHANKAR ----------------------------------------------------------------------  #  Order #                     Accession #                Episode #  1  [id number]                   [id number]                 [id number]  2  [id number]                   [id number]                 [id number]  3  [id number]                   [id number]                 [id number] ---------------------------------------------------------------------- Indications  Maternal care for known or suspected poor      O36.5930  fetal growth, third trimester, not applicable or  unspecified IUGR  Pyelectasis of fetus on prenatal ultrasound    O28.3  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  Low Risk NIPS (Negative AFP)  Encounter for other antenatal screening        Z36.2  follow-up  [redacted] weeks gestation of  pregnancy                Z3A.34 ---------------------------------------------------------------------- Fetal Evaluation  Num Of Fetuses:         1  Fetal Heart Rate(bpm):  149  Cardiac Activity:       Observed  Presentation:           Cephalic  Placenta:               Anterior  P. Cord Insertion:      Visualized  Amniotic Fluid  AFI FV:      Within normal limits  AFI Sum(cm)     %Tile       Largest Pocket(cm)  21.3            80          7.48  RUQ(cm)       RLQ(cm)       LUQ(cm)        LLQ(cm)  2.36          5.13          6.33  7.48 ---------------------------------------------------------------------- Biophysical Evaluation  Amniotic F.V:   Within normal limits       F. Tone:        Observed  F. Movement:    Observed                   Score:          8/8  F. Breathing:   Observed ---------------------------------------------------------------------- Biometry  BPD:      83.1  mm     G. Age:  33w 3d         31  %    CI:        72.35   %    70 - 86                                                          FL/HC:      20.0   %    19.4 - 21.8  HC:      310.8  mm     G. Age:  34w 5d         32  %    HC/AC:      1.07        0.96 - 1.11  AC:      291.8  mm     G. Age:  33w 1d         29  %    FL/BPD:     75.0   %    71 - 87  FL:       62.3  mm     G. Age:  32w 2d          7  %    FL/AC:      21.4   %    20 - 24  HUM:      55.4  mm     G. Age:  32w 2d         21  %  Est. FW:    2118  gm    4 lb 11 oz      20  % ---------------------------------------------------------------------- OB History  Gravidity:    1 ---------------------------------------------------------------------- Gestational Age  LMP:           32w 6d        Date:  01/20/19                 EDD:   10/27/19  U/S Today:     33w 3d                                        EDD:   10/23/19  Best:          34w 0d     Det. ByMarcella Dubs         EDD:   10/19/19                                      (02/26/19)  ---------------------------------------------------------------------- Anatomy  Cranium:               Appears normal  Aortic Arch:            Previously seen  Cavum:                 Previously seen        Ductal Arch:            Previously seen  Ventricles:            Previously seen        Diaphragm:              Appears normal  Choroid Plexus:        Previously seen        Stomach:                Appears normal, left                                                                        sided  Cerebellum:            Previously seen        Abdomen:                Appears normal  Posterior Fossa:       Previously seen        Abdominal Wall:         Previously seen  Nuchal Fold:           Not applicable (>20    Cord Vessels:           Previously seen                         wks GA)  Face:                  Orbits and profile     Kidneys:                Bilat Pyelectasis                         previously seen                                                                        Rt 41mm, Lt 35mm  Lips:                  Previously seen        Bladder:                Appears normal  Thoracic:              Appears normal         Spine:                  Previously seen  Heart:                 Appears normal         Upper Extremities:  Previously seen                         (4CH, axis, and                         situs)  RVOT:                  Appears normal         Lower Extremities:      Previously seen  LVOT:                  Appears normal ---------------------------------------------------------------------- Doppler - Fetal Vessels  Umbilical Artery   S/D     %tile                                              ADFV    RDFV   2.65       57                                                 No      No ---------------------------------------------------------------------- Comments  This patient was seen for a follow up growth scan due to fetal  growth restriction noted during her prior ultrasound exams.  She  denies any problems since her last exam and reports  feeling vigorous fetal movements throughout the day.  On today's exam, the fetal growth appears appropriate for her  gestational age.  There was normal amniotic fluid noted.  A biophysical profile performed today due to fetal growth  restriction was 8 out of 8.  Doppler studies of the umbilical arteries showed a normal  S/D ratio of 2.65.  There were no signs of absent or reversed  end-diastolic flow.  Mild bilateral pyelectasis measuring 0.7 to 0.8 cm continues  to be noted today.  The patient was advised that her baby will  need further imaging studies after birth to determine if any  treatment will be necessary for the pyelectasis.  We will continue to follow her with weekly fetal testing.  Another biophysical profile was scheduled in 1 week. ----------------------------------------------------------------------                   Ma Rings, MD Electronically Signed Final Report   09/07/2019 02:30 pm ----------------------------------------------------------------------  [place] MFM FETAL BPP WO NON STRESS  Result Date: 08/31/2019 ----------------------------------------------------------------------  OBSTETRICS REPORT                       (Signed Final 08/31/2019 09:56 am) ---------------------------------------------------------------------- Patient Info  ID #:       [id number]                          D.O.B.:  23-Aug-2000 (19 yrs)  Name:       Cassandra Bradley                     Visit Date: 08/31/2019 08:41 am ---------------------------------------------------------------------- Performed By  Attending:        Lin Landsman      Ref. Address:     Faculty  MD  Performed By:     Percell Boston          [facility]  Referred By:      Catalina Antigua MD ---------------------------------------------------------------------- Orders  #  Description                           Code        Ordered By  1  [place] MFM FETAL BPP WO NON               76819.01    RAVI SHANKAR     STRESS  2  [place] MFM UA CORD DOPPLER                76820.02    RAVI SHANKAR ----------------------------------------------------------------------  #  Order #                     Accession #                Episode #  1  [id number]                   [id number]                 [id number]  2  [id number]                   [id number]                 [id number] ---------------------------------------------------------------------- Indications  Maternal care for known or suspected poor      O36.5930  fetal growth, third trimester, not applicable or  unspecified IUGR  [redacted] weeks gestation of pregnancy                Z3A.33  Pyelectasis of fetus on prenatal ultrasound    O28.3  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  Low Risk NIPS (Negative AFP)  Encounter for other antenatal screening        Z36.2  follow-up ---------------------------------------------------------------------- Fetal Evaluation  Num Of Fetuses:         1  Fetal Heart Rate(bpm):  151  Cardiac Activity:       Observed  Presentation:  Cephalic  Placenta:               Anterior  Amniotic Fluid  AFI FV:      Within normal limits  AFI Sum(cm)     %Tile       Largest Pocket(cm)  17.3            63          4.98  RUQ(cm)       RLQ(cm)       LUQ(cm)        LLQ(cm)  3.58          4.01          4.98           4.73 ---------------------------------------------------------------------- Biophysical Evaluation  Amniotic F.V:   Within normal limits       F. Tone:        Observed  F. Movement:    Observed                   Score:          8/8  F. Breathing:   Observed ---------------------------------------------------------------------- OB History  Gravidity:    1  ---------------------------------------------------------------------- Gestational Age  LMP:           31w 6d        Date:  01/20/19                 EDD:   10/27/19  Best:          33w 0d     Det. ByMarcella Dubs         EDD:   10/19/19                                      (02/26/19) ---------------------------------------------------------------------- Anatomy  Thoracic:              Appears normal         Bladder:                Appears normal  Stomach:               Appears normal, left                         sided ---------------------------------------------------------------------- Doppler - Fetal Vessels  Umbilical Artery   S/D     %tile      RI    %tile                             ADFV    RDFV   2.37       34    0.58       40                                 N       N ---------------------------------------------------------------------- Impression  Antenatal testing due to known IUGR at the 9th%.  Biophysical profile 8/8 with good fetal movement, amniotic  fluid and UA Dopplers ---------------------------------------------------------------------- Recommendations  Continue weekly testing with UA Dopplers  Repeat growth in 2-3 weeks. ----------------------------------------------------------------------               Lin Landsman, MD Electronically Signed Final Report  08/31/2019 09:56 am ----------------------------------------------------------------------  [place] MFM FETAL BPP WO NON STRESS  Result Date: 08/24/2019 ----------------------------------------------------------------------  OBSTETRICS REPORT                       (Signed Final 08/24/2019 12:18 pm) ---------------------------------------------------------------------- Patient Info  ID #:       [id number]                          D.O.B.:  [birth year] (19 yrs)  Name:       Cassandra Bradley                     Visit Date: 08/24/2019 10:16 am ---------------------------------------------------------------------- Performed By  Attending:         Ma Rings MD         Ref. Address:     Faculty  Performed By:     Tommie Raymond BS,       [facility]                                [facility]  Referred By:      Catalina Antigua MD ---------------------------------------------------------------------- Orders  #  Description                           Code        Ordered By  1  [place] MFM FETAL BPP WO NON               76819.01    RAVI SHANKAR     STRESS  2  [place] MFM UA CORD DOPPLER                76820.02    RAVI [facility] ----------------------------------------------------------------------  #  Order #                     Accession #                Episode #  1  [id number]                   [id number]                 [id number]  2  [id number]  [id number]                 [id number] ---------------------------------------------------------------------- Indications  Maternal care for known or suspected poor      O36.5930  fetal growth, third trimester, not applicable or  unspecified IUGR  [redacted] weeks gestation of pregnancy                Z3A.32  Pyelectasis of fetus on prenatal ultrasound    O28.3  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 Cord Insertion:      Previously Visualized  AFI Sum(cm)     %Tile       Largest Pocket(cm)  21.95           85          9.71  RUQ(cm)       RLQ(cm)       LUQ(cm)        LLQ(cm)  9.71          4.03          2.55           5.66 ----------------------------------------------------------------------  Biophysical Evaluation  Amniotic F.V:   Within normal limits       F. Tone:        Observed  F. Movement:    Observed                   Score:          8/8  F. Breathing:   Observed ---------------------------------------------------------------------- OB History  Gravidity:    1 ---------------------------------------------------------------------- Gestational Age  LMP:           30w 6d        Date:  01/20/19                 EDD:   10/27/19  Best:          Armida Sans 0d     Det. By:  Marcella Dubs         EDD:   10/19/19                                      (02/26/19) ---------------------------------------------------------------------- Anatomy  Cranium:               Previously seen        LVOT:                   Previously seen  Cavum:                 Previously seen        Aortic Arch:            Previously seen  Ventricles:            Previously seen        Ductal Arch:            Previously seen  Choroid Plexus:        Previously  seen        Diaphragm:              Previously seen  Cerebellum:            Previously seen        Stomach:                Appears normal, left                                                                        sided  Posterior Fossa:       Previously seen        Abdomen:                Previously seen  Nuchal Fold:           Previously seen        Abdominal Wall:         Previously seen  Face:                  Orbits and profile     Cord Vessels:           Previously seen                         previously seen  Lips:                  Previously seen        Kidneys:                Bilateral UTD prev.  Palate:                Previously seen        Bladder:                Appears normal  Thoracic:              Previously seen        Spine:                  Limited views                                                                        previously seen  Heart:                 Appears normal         Upper Extremities:      Previously seen                         (4CH, axis, and                          situs)  RVOT:                  Previously seen        Lower  Extremities:      Previously seen ---------------------------------------------------------------------- Doppler - Fetal Vessels  Umbilical Artery   S/D     %tile                                              ADFV    RDFV   3.56       90                                                 No      No ---------------------------------------------------------------------- Cervix Uterus Adnexa  Cervix  Normal appearance by transabdominal scan.  Uterus  No abnormality visualized.  Right Ovary  Within normal limits. No adnexal mass visualized.  Left Ovary  Within normal limits. No adnexal mass visualized.  Cul De Sac  No free fluid seen.  Adnexa  No abnormality visualized. ---------------------------------------------------------------------- Comments  This patient was seen due to an IUGR fetus.  She denies any  problems since her last exam.  A biophysical profile performed today was 8 out of 8.  There was normal amniotic fluid noted on today's ultrasound  exam.  Doppler studies of the umbilical arteries performed due to  fetal growth restriction showed a normal S/D ratio of 3.56.  There were no signs of absent or reversed end-diastolic flow  noted today.  Mild bilateral pyelectasis (0.6 cm) continues to be noted  today.  A follow-up exam was scheduled in 1 week. ----------------------------------------------------------------------                   Ma Rings, MD Electronically Signed Final Report   08/24/2019 12:18 pm ----------------------------------------------------------------------  [place] MFM OB FOLLOW UP  Result Date: 09/07/2019 ----------------------------------------------------------------------  OBSTETRICS REPORT                       (Signed Final 09/07/2019 02:30 pm) ---------------------------------------------------------------------- Patient Info  ID #:       [id number]                          D.O.B.:  January 03, 2001 (19 yrs)  Name:        Cassandra Bradley                     Visit Date: 09/07/2019 01:51 pm ---------------------------------------------------------------------- Performed By  Attending:        Ma Rings MD         Ref. Address:     Faculty  Performed By:     Sandi Mealy        [facility]  Referred By:      Gigi Gin                    CONSTANT MD ---------------------------------------------------------------------- Orders  #  Description                           Code        Ordered By  1  [place] MFM FETAL BPP WO NON               76819.01    RAVI SHANKAR     STRESS  2  [place] MFM OB FOLLOW UP                   76816.01    RAVI SHANKAR  3  [place] MFM UA CORD DOPPLER                76820.02    RAVI SHANKAR ----------------------------------------------------------------------  #  Order #                     Accession #                Episode #  1  [id number]                   [id number]                 [id number]  2  [id number]                   [id number]                 [id number]  3  [id number]                   [id number]                 [id number] ---------------------------------------------------------------------- Indications  Maternal care for known or suspected poor      O36.5930  fetal growth, third trimester, not applicable or  unspecified IUGR  Pyelectasis of fetus on prenatal ultrasound    O28.3  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  Low Risk NIPS (Negative AFP)  Encounter for other antenatal screening        Z36.2  follow-up  [redacted] weeks gestation of pregnancy                Z3A.34 ---------------------------------------------------------------------- Fetal Evaluation  Num Of Fetuses:         1  Fetal Heart Rate(bpm):  149  Cardiac Activity:       Observed  Presentation:           Cephalic   Placenta:               Anterior  P. Cord Insertion:      Visualized  Amniotic Fluid  AFI FV:      Within normal limits  AFI Sum(cm)     %Tile       Largest Pocket(cm)  21.3  80          7.48  RUQ(cm)       RLQ(cm)       LUQ(cm)        LLQ(cm)  2.36          5.13          6.33           7.48 ---------------------------------------------------------------------- Biophysical Evaluation  Amniotic F.V:   Within normal limits       F. Tone:        Observed  F. Movement:    Observed                   Score:          8/8  F. Breathing:   Observed ---------------------------------------------------------------------- Biometry  BPD:      83.1  mm     G. Age:  33w 3d         31  %    CI:        72.35   %    70 - 86                                                          FL/HC:      20.0   %    19.4 - 21.8  HC:      310.8  mm     G. Age:  34w 5d         32  %    HC/AC:      1.07        0.96 - 1.11  AC:      291.8  mm     G. Age:  33w 1d         29  %    FL/BPD:     75.0   %    71 - 87  FL:       62.3  mm     G. Age:  32w 2d          7  %    FL/AC:      21.4   %    20 - 24  HUM:      55.4  mm     G. Age:  32w 2d         21  %  Est. FW:    2118  gm    4 lb 11 oz      20  % ---------------------------------------------------------------------- OB History  Gravidity:    1 ---------------------------------------------------------------------- Gestational Age  LMP:           32w 6d        Date:  01/20/19                 EDD:   10/27/19  U/S Today:     33w 3d                                        EDD:   10/23/19  Best:          34w 0d     Det. By:  Marcella Dubs  EDD:   10/19/19                                      (02/26/19) ---------------------------------------------------------------------- Anatomy  Cranium:               Appears normal         Aortic Arch:            Previously seen  Cavum:                 Previously seen        Ductal Arch:            Previously seen  Ventricles:            Previously  seen        Diaphragm:              Appears normal  Choroid Plexus:        Previously seen        Stomach:                Appears normal, left                                                                        sided  Cerebellum:            Previously seen        Abdomen:                Appears normal  Posterior Fossa:       Previously seen        Abdominal Wall:         Previously seen  Nuchal Fold:           Not applicable (>20    Cord Vessels:           Previously seen                         wks GA)  Face:                  Orbits and profile     Kidneys:                Bilat Pyelectasis                         previously seen                                                                        Rt 8mm, Lt 7mm  Lips:                  Previously seen        Bladder:                Appears normal  Thoracic:  Appears normal         Spine:                  Previously seen  Heart:                 Appears normal         Upper Extremities:      Previously seen                         (4CH, axis, and                         situs)  RVOT:                  Appears normal         Lower Extremities:      Previously seen  LVOT:                  Appears normal ---------------------------------------------------------------------- Doppler - Fetal Vessels  Umbilical Artery   S/D     %tile                                              ADFV    RDFV   2.65       57                                                 No      No ---------------------------------------------------------------------- Comments  This patient was seen for a follow up growth scan due to fetal  growth restriction noted during her prior ultrasound exams.  She denies any problems since her last exam and reports  feeling vigorous fetal movements throughout the day.  On today's exam, the fetal growth appears appropriate for her  gestational age.  There was normal amniotic fluid noted.  A biophysical profile performed today due to fetal growth   restriction was 8 out of 8.  Doppler studies of the umbilical arteries showed a normal  S/D ratio of 2.65.  There were no signs of absent or reversed  end-diastolic flow.  Mild bilateral pyelectasis measuring 0.7 to 0.8 cm continues  to be noted today.  The patient was advised that her baby will  need further imaging studies after birth to determine if any  treatment will be necessary for the pyelectasis.  We will continue to follow her with weekly fetal testing.  Another biophysical profile was scheduled in 1 week. ----------------------------------------------------------------------                   Ma Rings, MD Electronically Signed Final Report   09/07/2019 02:30 pm ----------------------------------------------------------------------  [place] MFM OB FOLLOW UP  Result Date: 08/17/2019 ----------------------------------------------------------------------  OBSTETRICS REPORT                       (Signed Final 08/17/2019 10:11 am) ---------------------------------------------------------------------- Patient Info  ID #:       [id number]                          D.O.B.:  Nov 04, 2000 (19 yrs)  Name:  Cassandra Bradley                     Visit Date: 08/17/2019 08:57 am ---------------------------------------------------------------------- Performed By  Attending:        Noralee Space MD        Ref. Address:     Faculty  Performed By:     Truitt Leep,      Location[facility]                                [facility]  Referred By:      Catalina Antigua MD ---------------------------------------------------------------------- Orders  #  Description                           Code        Ordered By  1  [place] MFM OB FOLLOW UP                   76816.01    RAVI SHANKAR  2  [place] MFM UA CORD DOPPLER                 76820.02    RAVI [facility] ----------------------------------------------------------------------  #  Order #                     Accession #                Episode #  1  [id number]                   [id number]                 [id number]  2  [id number]                   [id number]                 [id number] ---------------------------------------------------------------------- Indications  Pyelectasis of fetus on prenatal ultrasound    O28.3  Maternal care for known or suspected poor      O36.5930  fetal growth, third trimester, not applicable or  unspecified IUGR  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 Cord Insertion:      Visualized, central  Amniotic Fluid  AFI FV:      Within normal limits  AFI Sum(cm)     %Tile       Largest Pocket(cm)  21.1            82          6.22  RUQ(cm)       RLQ(cm)       LUQ(cm)        LLQ(cm)  5.38          4.26          5.24           6.22 ---------------------------------------------------------------------- Biometry  BPD:      77.6  mm     G. Age:  31w 1d         43  %    CI:        77.12   %    70 - 86                                                          FL/HC:      20.5   %    19.3 - 21.3  HC:      279.8  mm     G. Age:  30w 4d         10  %    HC/AC:      1.12        0.96 - 1.17  AC:      249.7  mm     G. Age:  29w 1d          6  %    FL/BPD:     74.0   %    71 - 87  FL:       57.4  mm     G. Age:  30w 1d         14  %    FL/AC:      23.0   %    20 - 24  LV:        4.6  mm  Est. FW:    1449  gm      3 lb 3 oz      9  % ---------------------------------------------------------------------- OB History   Gravidity:    1 ---------------------------------------------------------------------- Gestational Age  LMP:           29w 6d        Date:  01/20/19                 EDD:   10/27/19  U/S Today:     30w 2d                                        EDD:  10/24/19  Best:          31w 0d     Det. ByMarcella Dubs         EDD:   10/19/19                                      (02/26/19) ---------------------------------------------------------------------- Anatomy  Cranium:               Appears normal         Palate:                 Appears normal  Cavum:                 Appears normal         Heart:                  Appears normal                                                                        (4CH, axis, and                                                                        situs)  Ventricles:            Appears normal         Stomach:                Appears normal, left                                                                        sided  Choroid Plexus:        Appears normal         Kidneys:                Bilateral UTD (8                                                                        mm each)  Cerebellum:            Appears normal         Bladder:                Appears normal  Posterior Fossa:       Appears normal  Other:  Other anatomy previously imaged and  appeared normal. ---------------------------------------------------------------------- Doppler - Fetal Vessels  Umbilical Artery   S/D     %tile      RI    %tile   2.72       48    0.63       50 ---------------------------------------------------------------------- Cervix Uterus Adnexa  Cervix  Normal appearance by transabdominal scan. ---------------------------------------------------------------------- Impression  Patient return for fetal growth and renal assessments.  On  previous ultrasound bilateral mild urinary tract dilations were  seen.  She does not have gestational diabetes.  On cell free  fetal DNA screening, the risks  of fetal aneuploidies are not  increased.  On ultrasound, the estimated fetal weight is at the 9th  percentile.  Abdominal circumference measurement is at the  6 percentile.  Amniotic fluid is normal and good fetal activity  seen.  Umbilical artery Doppler showed normal forward  diastolic flow.  Bilateral urinary tract dilations each measuring  9 mm are seen.  Both kidneys, otherwise, appear normal with  no increased echogenicities.  I explained the findings consistent with fetal growth  restriction.  I explained that fetal growth restriction is difficult  to differentiate from a constitutionally small fetus.  I explained  ultrasound protocol of antenatal testing.  Blood pressure today at her office is 114/69 mmHg. ---------------------------------------------------------------------- Recommendations  -Continue weekly BPP and UA Doppler studies.  -Fetal growth assessment in 3 weeks. ----------------------------------------------------------------------                  Noralee Space, MD Electronically Signed Final Report   08/17/2019 10:11 am ----------------------------------------------------------------------  [place] MFM UA CORD DOPPLER  Result Date: 09/07/2019 ----------------------------------------------------------------------  OBSTETRICS REPORT                       (Signed Final 09/07/2019 02:30 pm) ---------------------------------------------------------------------- Patient Info  ID #:       [id number]                          D.O.B.:  05-22-2000 (19 yrs)  Name:       Cassandra Bradley                     Visit Date: 09/07/2019 01:51 pm ---------------------------------------------------------------------- Performed By  Attending:        Ma Rings MD         Ref. Address:     Faculty  Performed By:     Sandi Mealy        Location[facility]                                     [facility]  Referred By:      Gigi Gin                    CONSTANT MD ---------------------------------------------------------------------- Orders  #  Description                           Code        Ordered By  1  [place] MFM FETAL BPP WO NON               76819.01    RAVI SHANKAR     STRESS  2  [place] MFM OB FOLLOW UP                   76816.01    RAVI SHANKAR  3  [place] MFM UA CORD DOPPLER                76820.02    RAVI SHANKAR ----------------------------------------------------------------------  #  Order #                     Accession #                Episode #  1  [id number]                   [id number]                 [id number]  2  [id number]                   [id number]                 [id number]  3  [id number]                   [id number]                 [id number] ---------------------------------------------------------------------- Indications  Maternal care for known or suspected poor      O36.5930  fetal growth, third trimester, not applicable or  unspecified IUGR  Pyelectasis of fetus on prenatal ultrasound    O28.3  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 Cord Insertion:      Visualized  Amniotic Fluid  AFI FV:      Within normal limits  AFI Sum(cm)     %Tile       Largest Pocket(cm)  21.3            80          7.48  RUQ(cm)       RLQ(cm)       LUQ(cm)        LLQ(cm)  2.36          5.13          6.33  7.48 ---------------------------------------------------------------------- Biophysical Evaluation  Amniotic F.V:   Within normal limits       F. Tone:         Observed  F. Movement:    Observed                   Score:          8/8  F. Breathing:   Observed ---------------------------------------------------------------------- Biometry  BPD:      83.1  mm     G. Age:  33w 3d         31  %    CI:        72.35   %    70 - 86                                                          FL/HC:      20.0   %    19.4 - 21.8  HC:      310.8  mm     G. Age:  34w 5d         32  %    HC/AC:      1.07        0.96 - 1.11  AC:      291.8  mm     G. Age:  33w 1d         29  %    FL/BPD:     75.0   %    71 - 87  FL:       62.3  mm     G. Age:  32w 2d          7  %    FL/AC:      21.4   %    20 - 24  HUM:      55.4  mm     G. Age:  32w 2d         21  %  Est. FW:    2118  gm    4 lb 11 oz      20  % ---------------------------------------------------------------------- OB History  Gravidity:    1 ---------------------------------------------------------------------- Gestational Age  LMP:           32w 6d        Date:  01/20/19                 EDD:   10/27/19  U/S Today:     33w 3d                                        EDD:   10/23/19  Best:          34w 0d     Det. ByMarcella Dubs         EDD:   10/19/19                                      (02/26/19) ---------------------------------------------------------------------- Anatomy  Cranium:               Appears normal  Aortic Arch:            Previously seen  Cavum:                 Previously seen        Ductal Arch:            Previously seen  Ventricles:            Previously seen        Diaphragm:              Appears normal  Choroid Plexus:        Previously seen        Stomach:                Appears normal, left                                                                        sided  Cerebellum:            Previously seen        Abdomen:                Appears normal  Posterior Fossa:       Previously seen        Abdominal Wall:         Previously seen  Nuchal Fold:           Not applicable (>20    Cord Vessels:            Previously seen                         wks GA)  Face:                  Orbits and profile     Kidneys:                Bilat Pyelectasis                         previously seen                                                                        Rt 8mm, Lt 7mm  Lips:                  Previously seen        Bladder:                Appears normal  Thoracic:              Appears normal         Spine:                  Previously seen  Heart:                 Appears normal         Upper  Extremities:      Previously seen                         (4CH, axis, and                         situs)  RVOT:                  Appears normal         Lower Extremities:      Previously seen  LVOT:                  Appears normal ---------------------------------------------------------------------- Doppler - Fetal Vessels  Umbilical Artery   S/D     %tile                                              ADFV    RDFV   2.65       57                                                 No      No ---------------------------------------------------------------------- Comments  This patient was seen for a follow up growth scan due to fetal  growth restriction noted during her prior ultrasound exams.  She denies any problems since her last exam and reports  feeling vigorous fetal movements throughout the day.  On today's exam, the fetal growth appears appropriate for her  gestational age.  There was normal amniotic fluid noted.  A biophysical profile performed today due to fetal growth  restriction was 8 out of 8.  Doppler studies of the umbilical arteries showed a normal  S/D ratio of 2.65.  There were no signs of absent or reversed  end-diastolic flow.  Mild bilateral pyelectasis measuring 0.7 to 0.8 cm continues  to be noted today.  The patient was advised that her baby will  need further imaging studies after birth to determine if any  treatment will be necessary for the pyelectasis.  We will continue to follow her with weekly fetal  testing.  Another biophysical profile was scheduled in 1 week. ----------------------------------------------------------------------                   Ma Rings, MD Electronically Signed Final Report   09/07/2019 02:30 pm ----------------------------------------------------------------------  [place] MFM UA CORD DOPPLER  Result Date: 08/31/2019 ----------------------------------------------------------------------  OBSTETRICS REPORT                       (Signed Final 08/31/2019 09:56 am) ---------------------------------------------------------------------- Patient Info  ID #:       [id number]                          D.O.B.:  Oct 29, 2000 (19 yrs)  Name:       Cassandra Bradley                     Visit Date: 08/31/2019 08:41 am ---------------------------------------------------------------------- Performed By  Attending:        Lin Landsman      Ref. Address:     Faculty  MD  Performed By:     Percell Boston          Location[facility]                                     [facility]  Referred By:      Catalina Antigua MD ---------------------------------------------------------------------- Orders  #  Description                           Code        Ordered By  1  [place] MFM FETAL BPP WO NON               76819.01    RAVI SHANKAR     STRESS  2  [place] MFM UA CORD DOPPLER                76820.02    RAVI SHANKAR ----------------------------------------------------------------------  #  Order #                     Accession #                Episode #  1  [id number]                   [id number]                 [id number]  2  [id number]                   [id number]                 [id number] ---------------------------------------------------------------------- Indications  Maternal care for known or suspected poor       O36.5930  fetal growth, third trimester, not applicable or  unspecified IUGR  [redacted] weeks gestation of pregnancy                Z3A.33  Pyelectasis of fetus on prenatal ultrasound    O28.3  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  Low Risk NIPS (Negative AFP)  Encounter for other antenatal screening        Z36.2  follow-up ---------------------------------------------------------------------- Fetal Evaluation  Num Of Fetuses:         1  Fetal Heart Rate(bpm):  151  Cardiac Activity:       Observed  Presentation:  Cephalic  Placenta:               Anterior  Amniotic Fluid  AFI FV:      Within normal limits  AFI Sum(cm)     %Tile       Largest Pocket(cm)  17.3            63          4.98  RUQ(cm)       RLQ(cm)       LUQ(cm)        LLQ(cm)  3.58          4.01          4.98           4.73 ---------------------------------------------------------------------- Biophysical Evaluation  Amniotic F.V:   Within normal limits       F. Tone:        Observed  F. Movement:    Observed                   Score:          8/8  F. Breathing:   Observed ---------------------------------------------------------------------- OB History  Gravidity:    1 ---------------------------------------------------------------------- Gestational Age  LMP:           31w 6d        Date:  01/20/19                 EDD:   10/27/19  Best:          33w 0d     Det. ByMarcella Dubs         EDD:   10/19/19                                      (02/26/19) ---------------------------------------------------------------------- Anatomy  Thoracic:              Appears normal         Bladder:                Appears normal  Stomach:               Appears normal, left                         sided ---------------------------------------------------------------------- Doppler - Fetal Vessels  Umbilical Artery   S/D     %tile      RI    %tile                             ADFV    RDFV   2.37       34    0.58       40                                  N       N ---------------------------------------------------------------------- Impression  Antenatal testing due to known IUGR at the 9th%.  Biophysical profile 8/8 with good fetal movement, amniotic  fluid and UA Dopplers ---------------------------------------------------------------------- Recommendations  Continue weekly testing with UA Dopplers  Repeat growth in 2-3 weeks. ----------------------------------------------------------------------               Lin Landsman, MD Electronically Signed Final Report  08/31/2019 09:56 am ----------------------------------------------------------------------  [place] MFM UA CORD DOPPLER  Result Date: 08/24/2019 ----------------------------------------------------------------------  OBSTETRICS REPORT                       (Signed Final 08/24/2019 12:18 pm) ---------------------------------------------------------------------- Patient Info  ID #:       [id number]                          D.O.B.:  07-19-2000 (19 yrs)  Name:       Cassandra Bradley                     Visit Date: 08/24/2019 10:16 am ---------------------------------------------------------------------- Performed By  Attending:        Ma Rings MD         Ref. Address:     Faculty  Performed By:     Tommie Raymond BS,       Location[facility]                                [facility]  Referred By:      Catalina Antigua MD ---------------------------------------------------------------------- Orders  #  Description                           Code        Ordered By  1  [place] MFM FETAL BPP WO NON               76819.01    RAVI SHANKAR     STRESS  2  [place] MFM UA CORD DOPPLER                76820.02    RAVI [facility] ----------------------------------------------------------------------  #  Order #                      Accession #                Episode #  1  [id number]                   [id number]                 [id number]  2  [id number]  [id number]                 [id number] ---------------------------------------------------------------------- Indications  Maternal care for known or suspected poor      O36.5930  fetal growth, third trimester, not applicable or  unspecified IUGR  [redacted] weeks gestation of pregnancy                Z3A.32  Pyelectasis of fetus on prenatal ultrasound    O28.3  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 Cord Insertion:      Previously Visualized  AFI Sum(cm)     %Tile       Largest Pocket(cm)  21.95           85          9.71  RUQ(cm)       RLQ(cm)       LUQ(cm)        LLQ(cm)  9.71          4.03          2.55           5.66 ---------------------------------------------------------------------- Biophysical Evaluation  Amniotic F.V:   Within normal limits       F. Tone:        Observed  F. Movement:    Observed                   Score:          8/8  F. Breathing:   Observed ---------------------------------------------------------------------- OB History  Gravidity:    1 ---------------------------------------------------------------------- Gestational Age  LMP:           30w 6d        Date:  01/20/19                 EDD:   10/27/19  Best:          Armida Sans 0d     Det. By:  Marcella Dubs         EDD:   10/19/19                                      (02/26/19) ---------------------------------------------------------------------- Anatomy  Cranium:               Previously seen        LVOT:                   Previously seen  Cavum:                 Previously seen        Aortic  Arch:            Previously seen  Ventricles:            Previously seen        Ductal Arch:            Previously seen  Choroid Plexus:        Previously  seen        Diaphragm:              Previously seen  Cerebellum:            Previously seen        Stomach:                Appears normal, left                                                                        sided  Posterior Fossa:       Previously seen        Abdomen:                Previously seen  Nuchal Fold:           Previously seen        Abdominal Wall:         Previously seen  Face:                  Orbits and profile     Cord Vessels:           Previously seen                         previously seen  Lips:                  Previously seen        Kidneys:                Bilateral UTD prev.  Palate:                Previously seen        Bladder:                Appears normal  Thoracic:              Previously seen        Spine:                  Limited views                                                                        previously seen  Heart:                 Appears normal         Upper Extremities:      Previously seen                         (4CH, axis, and                         situs)  RVOT:                  Previously seen        Lower Extremities:  Previously seen ---------------------------------------------------------------------- Doppler - Fetal Vessels  Umbilical Artery   S/D     %tile                                              ADFV    RDFV   3.56       90                                                 No      No ---------------------------------------------------------------------- Cervix Uterus Adnexa  Cervix  Normal appearance by transabdominal scan.  Uterus  No abnormality visualized.  Right Ovary  Within normal limits. No adnexal mass visualized.  Left Ovary  Within normal limits. No adnexal mass visualized.  Cul De Sac  No free fluid seen.  Adnexa  No abnormality visualized.  ---------------------------------------------------------------------- Comments  This patient was seen due to an IUGR fetus.  She denies any  problems since her last exam.  A biophysical profile performed today was 8 out of 8.  There was normal amniotic fluid noted on today's ultrasound  exam.  Doppler studies of the umbilical arteries performed due to  fetal growth restriction showed a normal S/D ratio of 3.56.  There were no signs of absent or reversed end-diastolic flow  noted today.  Mild bilateral pyelectasis (0.6 cm) continues to be noted  today.  A follow-up exam was scheduled in 1 week. ----------------------------------------------------------------------                   Ma Rings, MD Electronically Signed Final Report   08/24/2019 12:18 pm ----------------------------------------------------------------------  [place] MFM UA CORD DOPPLER  Result Date: 08/17/2019 ----------------------------------------------------------------------  OBSTETRICS REPORT                       (Signed Final 08/17/2019 10:11 am) ---------------------------------------------------------------------- Patient Info  ID #:       [id number]                          D.O.B.:  [birth year] (19 yrs)  Name:       Cassandra Bradley                     Visit Date: 08/17/2019 08:57 am ---------------------------------------------------------------------- Performed By  Attending:        Noralee Space MD        Ref. Address:     Faculty  Performed By:     Truitt Leep,      [facility]  Referred By:      Gigi Gin                    CONSTANT MD ---------------------------------------------------------------------- Orders  #  Description                           Code        Ordered By  1  [place] MFM OB FOLLOW UP                    76816.01    RAVI SHANKAR  2  [place] MFM UA CORD DOPPLER                76820.02    [facility] ----------------------------------------------------------------------  #  Order #                     Accession #                Episode #  1  [id number]                   [id number]                 [id number]  2  [id number]                   [id number]                 [id number] ---------------------------------------------------------------------- Indications  Pyelectasis of fetus on prenatal ultrasound    O28.3  Maternal care for known or suspected poor      O36.5930  fetal growth, third trimester, not applicable or  unspecified IUGR  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  Low Risk NIPS (Negative AFP)  Antenatal follow-up for nonvisualized fetal    Z36.2  anatomy  [redacted] weeks gestation of pregnancy                Z3A.31 ---------------------------------------------------------------------- Fetal Evaluation  Num Of Fetuses:         1  Cardiac Activity:       Observed  Presentation:           Cephalic  Placenta:               Anterior  P. Cord Insertion:      Visualized, central  Amniotic Fluid  AFI FV:      Within normal limits  AFI Sum(cm)     %Tile       Largest Pocket(cm)  21.1            82          6.22  RUQ(cm)       RLQ(cm)       LUQ(cm)        LLQ(cm)  5.38          4.26          5.24           6.22 ---------------------------------------------------------------------- Biometry  BPD:      77.6  mm  G. Age:  31w 1d         43  %    CI:        77.12   %    70 - 86                                                          FL/HC:      20.5   %    19.3 - 21.3  HC:      279.8  mm     G. Age:  30w 4d         10  %    HC/AC:      1.12        0.96 - 1.17  AC:      249.7  mm     G. Age:  29w 1d          6  %    FL/BPD:     74.0   %    71 - 87  FL:       57.4  mm     G. Age:  30w 1d         14  %    FL/AC:      23.0   %    20 - 24  LV:        4.6  mm  Est. FW:    1449  gm      3 lb 3 oz      9  %  ---------------------------------------------------------------------- OB History  Gravidity:    1 ---------------------------------------------------------------------- Gestational Age  LMP:           29w 6d        Date:  01/20/19                 EDD:   10/27/19  U/S Today:     30w 2d                                        EDD:   10/24/19  Best:          31w 0d     Det. By:  Marcella Dubs         EDD:   10/19/19                                      (02/26/19) ---------------------------------------------------------------------- Anatomy  Cranium:               Appears normal         Palate:                 Appears normal  Cavum:                 Appears normal         Heart:                  Appears normal                                                                        (  4CH, axis, and                                                                        situs)  Ventricles:            Appears normal         Stomach:                Appears normal, left                                                                        sided  Choroid Plexus:        Appears normal         Kidneys:                Bilateral UTD (8                                                                        mm each)  Cerebellum:            Appears normal         Bladder:                Appears normal  Posterior Fossa:       Appears normal  Other:  Other anatomy previously imaged and appeared normal. ---------------------------------------------------------------------- Doppler - Fetal Vessels  Umbilical Artery   S/D     %tile      RI    %tile   2.72       48    0.63       50 ---------------------------------------------------------------------- Cervix Uterus Adnexa  Cervix  Normal appearance by transabdominal scan. ---------------------------------------------------------------------- Impression  Patient return for fetal growth and renal assessments.  On  previous ultrasound bilateral mild urinary tract dilations were  seen.  She  does not have gestational diabetes.  On cell free  fetal DNA screening, the risks of fetal aneuploidies are not  increased.  On ultrasound, the estimated fetal weight is at the 9th  percentile.  Abdominal circumference measurement is at the  6 percentile.  Amniotic fluid is normal and good fetal activity  seen.  Umbilical artery Doppler showed normal forward  diastolic flow.  Bilateral urinary tract dilations each measuring  9 mm are seen.  Both kidneys, otherwise, appear normal with  no increased echogenicities.  I explained the findings consistent with fetal growth  restriction.  I explained that fetal growth restriction is difficult  to differentiate from a constitutionally small fetus.  I explained  ultrasound protocol of antenatal testing.  Blood pressure today at her office is 114/69 mmHg. ---------------------------------------------------------------------- Recommendations  -Continue weekly BPP and UA Doppler studies.  -Fetal growth assessment in  3 weeks. ----------------------------------------------------------------------                  Noralee Space, MD Electronically Signed Final Report   08/17/2019 10:11 am ----------------------------------------------------------------------   Assessment and Plan:  Pregnancy: G1P0 at [redacted]w[redacted]d   Preterm labor symptoms and general obstetric precautions including but not limited to vaginal bleeding, contractions, leaking of fluid and fetal movement were reviewed in detail with the patient. I discussed the assessment and treatment plan with the patient. The patient was provided an opportunity to ask questions and all were answered. The patient agreed with the plan and demonstrated an understanding of the instructions. The patient was advised to call back or seek an in-person office evaluation/go to [facility] at [facility] for any urgent or concerning symptoms. Please refer to After Visit Summary for other counseling recommendations.   I provided 10  minutes of face-to-face time during this encounter.  Return in about 2 weeks (around 09/22/2019) for ROB.  Future Appointments  Date Time Provider Department Center  09/15/2019 10:30 AM [facility] NURSE [facility]  09/15/2019 10:45 AM [facility] US4 [facility]  09/22/2019  2:45 PM [facility] NURSE [facility]  09/22/2019  3:00 PM [facility] US1 [facility]  09/29/2019  9:30 AM [facility] NURSE [facility]  09/29/2019  9:45 AM [facility] US5 [facility]    Coral Ceo, MD [facility] [employer], [facility] 09/08/19

## 2019-09-15 ENCOUNTER — Encounter: Payer: Self-pay | Admitting: *Deleted

## 2019-09-15 ENCOUNTER — Other Ambulatory Visit: Payer: Self-pay

## 2019-09-15 ENCOUNTER — Ambulatory Visit: Admitting: *Deleted

## 2019-09-15 ENCOUNTER — Ambulatory Visit: Attending: Obstetrics and Gynecology

## 2019-09-15 DIAGNOSIS — Z148 Genetic carrier of other disease: Secondary | ICD-10-CM

## 2019-09-15 DIAGNOSIS — O99013 Anemia complicating pregnancy, third trimester: Secondary | ICD-10-CM | POA: Insufficient documentation

## 2019-09-15 DIAGNOSIS — Z362 Encounter for other antenatal screening follow-up: Secondary | ICD-10-CM

## 2019-09-15 DIAGNOSIS — Z3401 Encounter for supervision of normal first pregnancy, first trimester: Secondary | ICD-10-CM

## 2019-09-15 DIAGNOSIS — Z3A35 35 weeks gestation of pregnancy: Secondary | ICD-10-CM

## 2019-09-15 DIAGNOSIS — O365990 Maternal care for other known or suspected poor fetal growth, unspecified trimester, not applicable or unspecified: Secondary | ICD-10-CM | POA: Diagnosis present

## 2019-09-15 DIAGNOSIS — O365930 Maternal care for other known or suspected poor fetal growth, third trimester, not applicable or unspecified: Secondary | ICD-10-CM

## 2019-09-15 DIAGNOSIS — O283 Abnormal ultrasonic finding on antenatal screening of mother: Secondary | ICD-10-CM | POA: Diagnosis not present

## 2019-09-22 ENCOUNTER — Other Ambulatory Visit: Payer: Self-pay

## 2019-09-22 ENCOUNTER — Ambulatory Visit (INDEPENDENT_AMBULATORY_CARE_PROVIDER_SITE_OTHER): Admitting: Obstetrics

## 2019-09-22 ENCOUNTER — Other Ambulatory Visit (HOSPITAL_COMMUNITY)
Admission: RE | Admit: 2019-09-22 | Discharge: 2019-09-22 | Disposition: A | Discharge status: Home / Self Care | Source: Ambulatory Visit | Attending: Obstetrics | Admitting: Obstetrics

## 2019-09-22 ENCOUNTER — Ambulatory Visit: Admitting: *Deleted

## 2019-09-22 ENCOUNTER — Ambulatory Visit (HOSPITAL_BASED_OUTPATIENT_CLINIC_OR_DEPARTMENT_OTHER)

## 2019-09-22 ENCOUNTER — Other Ambulatory Visit: Payer: Self-pay | Admitting: *Deleted

## 2019-09-22 ENCOUNTER — Encounter: Payer: Self-pay | Admitting: Obstetrics

## 2019-09-22 ENCOUNTER — Encounter: Payer: Self-pay | Admitting: *Deleted

## 2019-09-22 VITALS — BP 113/76 | HR 86 | Wt 145.0 lb

## 2019-09-22 DIAGNOSIS — O99013 Anemia complicating pregnancy, third trimester: Secondary | ICD-10-CM

## 2019-09-22 DIAGNOSIS — O0990 Supervision of high risk pregnancy, unspecified, unspecified trimester: Secondary | ICD-10-CM | POA: Diagnosis not present

## 2019-09-22 DIAGNOSIS — O365990 Maternal care for other known or suspected poor fetal growth, unspecified trimester, not applicable or unspecified: Secondary | ICD-10-CM

## 2019-09-22 DIAGNOSIS — O365930 Maternal care for other known or suspected poor fetal growth, third trimester, not applicable or unspecified: Secondary | ICD-10-CM

## 2019-09-22 DIAGNOSIS — Z362 Encounter for other antenatal screening follow-up: Secondary | ICD-10-CM

## 2019-09-22 DIAGNOSIS — Z3401 Encounter for supervision of normal first pregnancy, first trimester: Secondary | ICD-10-CM | POA: Insufficient documentation

## 2019-09-22 DIAGNOSIS — Z23 Encounter for immunization: Secondary | ICD-10-CM | POA: Diagnosis not present

## 2019-09-22 DIAGNOSIS — Z3A36 36 weeks gestation of pregnancy: Secondary | ICD-10-CM

## 2019-09-22 DIAGNOSIS — O283 Abnormal ultrasonic finding on antenatal screening of mother: Secondary | ICD-10-CM

## 2019-09-22 DIAGNOSIS — Z148 Genetic carrier of other disease: Secondary | ICD-10-CM

## 2019-09-22 MED ORDER — BLOOD PRESSURE KIT DEVI: 1.0000 | 0 refills | Status: AC | PRN

## 2019-09-22 NOTE — Progress Notes (Signed)
Subjective:  Cassandra Bradley is a 19 y.o. G1P0 at [redacted]w[redacted]d being seen today for ongoing prenatal care.  She is currently monitored for the following issues for this high-risk pregnancy and has Encounter for supervision of normal first pregnancy in first trimester; Alpha thalassemia silent carrier; Uterine size date discrepancy pregnancy; and Anemia in pregnancy on their problem list.  Patient reports no complaints.  Contractions: Irritability. Vag. Bleeding: None.  Movement: Present. Denies leaking of fluid.   The following portions of the patient's history were reviewed and updated as appropriate: allergies, current medications, past family history, past medical history, past social history, past surgical history and problem list. Problem list updated.  Objective:   Vitals:   09/22/19 1100  BP: 113/76  Pulse: 86  Weight: 145 lb (65.8 kg)    Fetal Status:     Movement: Present     General:  Alert, oriented and cooperative. Patient is in no acute distress.  Skin: Skin is warm and dry. No rash noted.   Cardiovascular: Normal heart rate noted  Respiratory: Normal respiratory effort, no problems with respiration noted  Abdomen: Soft, gravid, appropriate for gestational age. Pain/Pressure: Absent     Pelvic:  Cervical exam deferred        Extremities: Normal range of motion.  Edema: None  Mental Status: Normal mood and affect. Normal behavior. Normal judgment and thought content.   Urinalysis:      Assessment and Plan:  Pregnancy: G1P0 at [redacted]w[redacted]d  1. Supervision of high risk pregnancy, antepartum Rx: - Culture, beta strep (group b only) - Cervicovaginal ancillary only( [facility])  2. Poor fetal growth affecting management of mother in third trimester, single or unspecified fetus - normal interval growth, AFI and Dopplers on last ultrasound - continue weekly BPP and Dopplers - delivery probably between 37-39 weeks  3. Anemia during pregnancy in third trimester Rx: - CBC  Preterm  labor symptoms and general obstetric precautions including but not limited to vaginal bleeding, contractions, leaking of fluid and fetal movement were reviewed in detail with the patient. Please refer to After Visit Summary for other counseling recommendations.   Return in about 1 week (around 09/29/2019) for MyChart.   Brock Bad, MD  09/22/19

## 2019-09-22 NOTE — Progress Notes (Addendum)
Pt presents for ROB/GBS/GC/CT today. 2nd Covid vaccine due next Saturday  Flu vaccine given w/o difficult LD

## 2019-09-22 NOTE — Addendum Note (Signed)
Addended by: Dalphine Handing on: 09/22/2019 11:31 AM   Modules accepted: Orders

## 2019-09-22 NOTE — Addendum Note (Signed)
Addended by: Dalphine Handing on: 09/22/2019 11:40 AM   Modules accepted: Orders

## 2019-09-23 LAB — CBC
Hematocrit: 34.9 % (ref 34.0–46.6)
Hemoglobin: 11.1 g/dL (ref 11.1–15.9)
MCH: 24.5 pg — ABNORMAL LOW (ref 26.6–33.0)
MCHC: 31.8 g/dL (ref 31.5–35.7)
MCV: 77 fL — ABNORMAL LOW (ref 79–97)
Platelets: 237 10*3/uL (ref 150–450)
RBC: 4.53 x10E6/uL (ref 3.77–5.28)
RDW: 22.3 % — ABNORMAL HIGH (ref 11.7–15.4)
WBC: 7.4 10*3/uL (ref 3.4–10.8)

## 2019-09-25 LAB — CERVICOVAGINAL ANCILLARY ONLY
Chlamydia: NEGATIVE
Comment: NEGATIVE
Comment: NORMAL
Neisseria Gonorrhea: NEGATIVE

## 2019-09-26 LAB — CULTURE, BETA STREP (GROUP B ONLY): Strep Gp B Culture: NEGATIVE

## 2019-09-29 ENCOUNTER — Ambulatory Visit: Admitting: *Deleted

## 2019-09-29 ENCOUNTER — Other Ambulatory Visit: Payer: Self-pay

## 2019-09-29 ENCOUNTER — Ambulatory Visit: Attending: Obstetrics and Gynecology

## 2019-09-29 DIAGNOSIS — Z3401 Encounter for supervision of normal first pregnancy, first trimester: Secondary | ICD-10-CM | POA: Diagnosis present

## 2019-09-29 DIAGNOSIS — Z148 Genetic carrier of other disease: Secondary | ICD-10-CM

## 2019-09-29 DIAGNOSIS — O99013 Anemia complicating pregnancy, third trimester: Secondary | ICD-10-CM | POA: Diagnosis present

## 2019-09-29 DIAGNOSIS — Z3A37 37 weeks gestation of pregnancy: Secondary | ICD-10-CM

## 2019-09-29 DIAGNOSIS — O283 Abnormal ultrasonic finding on antenatal screening of mother: Secondary | ICD-10-CM | POA: Diagnosis not present

## 2019-09-29 DIAGNOSIS — O403XX0 Polyhydramnios, third trimester, not applicable or unspecified: Secondary | ICD-10-CM | POA: Diagnosis not present

## 2019-09-29 DIAGNOSIS — Z362 Encounter for other antenatal screening follow-up: Secondary | ICD-10-CM | POA: Diagnosis not present

## 2019-09-29 DIAGNOSIS — O365930 Maternal care for other known or suspected poor fetal growth, third trimester, not applicable or unspecified: Secondary | ICD-10-CM

## 2019-09-29 DIAGNOSIS — O365990 Maternal care for other known or suspected poor fetal growth, unspecified trimester, not applicable or unspecified: Secondary | ICD-10-CM | POA: Diagnosis present

## 2019-10-02 ENCOUNTER — Telehealth (INDEPENDENT_AMBULATORY_CARE_PROVIDER_SITE_OTHER): Admitting: Obstetrics

## 2019-10-02 ENCOUNTER — Encounter: Payer: Self-pay | Admitting: Obstetrics

## 2019-10-02 VITALS — BP 116/63 | HR 105

## 2019-10-02 DIAGNOSIS — O99013 Anemia complicating pregnancy, third trimester: Secondary | ICD-10-CM

## 2019-10-02 DIAGNOSIS — D649 Anemia, unspecified: Secondary | ICD-10-CM

## 2019-10-02 DIAGNOSIS — Z3401 Encounter for supervision of normal first pregnancy, first trimester: Secondary | ICD-10-CM

## 2019-10-02 DIAGNOSIS — Z3A37 37 weeks gestation of pregnancy: Secondary | ICD-10-CM

## 2019-10-02 NOTE — Progress Notes (Signed)
OBSTETRICS PRENATAL VIRTUAL VISIT ENCOUNTER NOTE  Provider location: [facility] for [facility]   I connected with Cassandra Bradley on 10/02/19 at  8:30 AM EDT by MyChart Video Encounter at home and verified that I am speaking with the correct person using two identifiers.   I discussed the limitations, risks, security and privacy concerns of performing an evaluation and management service virtually and the availability of in person appointments. I also discussed with the patient that there may be a patient responsible charge related to this service. The patient expressed understanding and agreed to proceed. Subjective:  Cassandra Bradley is a 19 y.o. G1P0 at [redacted]w[redacted]d being seen today for ongoing prenatal care.  She is currently monitored for the following issues for this low-risk pregnancy and has Encounter for supervision of normal first pregnancy in first trimester; Alpha thalassemia silent carrier; Uterine size date discrepancy pregnancy; and Anemia in pregnancy on their problem list.  Patient reports no complaints.  Contractions: Irregular. Vag. Bleeding: None.  Movement: Present. Denies any leaking of fluid.   The following portions of the patient's history were reviewed and updated as appropriate: allergies, current medications, past family history, past medical history, past social history, past surgical history and problem list.   Objective:   Vitals:   10/02/19 0834  BP: 116/63  Pulse: (!) 105    Fetal Status:     Movement: Present     General:  Alert, oriented and cooperative. Patient is in no acute distress.  Respiratory: Normal respiratory effort, no problems with respiration noted  Mental Status: Normal mood and affect. Normal behavior. Normal judgment and thought content.  Rest of physical exam deferred due to type of encounter  Imaging: [place] MFM FETAL BPP WO NON STRESS  Result Date: 09/29/2019 ----------------------------------------------------------------------   OBSTETRICS REPORT                       (Signed Final 09/29/2019 10:43 am) ---------------------------------------------------------------------- Patient Info  ID #:       [id number]                          D.O.B.:  [birth year] (19 yrs)  Name:       Cassandra Bradley                     Visit Date: 09/29/2019 10:24 am ---------------------------------------------------------------------- Performed By  Attending:        Lin Landsman      Ref. Address:     Faculty                    MD  Performed By:     Eden Lathe BS      [facility]  Referred By:      Catalina Antigua MD ---------------------------------------------------------------------- Orders  #  Description                           Code        Ordered By  1  [place] MFM FETAL BPP WO NON               76819.01    CORENTHIAN     STRESS                                            BOOKER  2  [place] MFM UA CORD DOPPLER                16109.60    Lin Landsman ----------------------------------------------------------------------  #  Order #                     Accession #                Episode #  1  [id number]                   [id number]                 [id number]  2  [id number]                   [id number]                 [id number] ---------------------------------------------------------------------- Indications  Encounter for other antenatal screening        Z36.2  follow-up  Maternal care for known or suspected poor      O36.5930  fetal growth, third trimester, not applicable or  unspecified IUGR  Polyhydramnios, third trimester, antepartum    O40.3XX0  condition or complication, unspecified fetus  Pyelectasis of fetus on prenatal ultrasound    O28.3  Genetic  carrier (silent carrier for alpha thal)Z14.8  Low Risk NIPS (Negative AFP)  [redacted] weeks gestation of pregnancy                Z3A.37 ---------------------------------------------------------------------- Fetal Evaluation  Num Of Fetuses:         1  Fetal Heart Rate(bpm):  125  Cardiac Activity:       Observed  Presentation:           Cephalic  Amniotic Fluid  AFI FV:      Mild Polyhydramnios  AFI Sum(cm)     %Tile       Largest Pocket(cm)  28.1            > 97        9.6  RUQ(cm)       RLQ(cm)       LUQ(cm)        LLQ(cm)  9.6           6.5           6.7            5.3 ---------------------------------------------------------------------- Biophysical Evaluation  Amniotic F.V:   Polyhydramnios             F. Tone:        Observed  F. Movement:    Observed                   Score:          8/8  F. Breathing:   Observed ---------------------------------------------------------------------- OB History  Gravidity:    1 ---------------------------------------------------------------------- Gestational Age  LMP:           36w 0d        Date:  01/20/19                 EDD:   10/27/19  Best:          37w 1d     Det. By:  Marcella Dubs         EDD:   10/19/19                                      (02/26/19) ---------------------------------------------------------------------- Doppler - Fetal Vessels  Umbilical Artery   S/D     %tile      RI    %tile      PI    %tile            ADFV    RDFV   2.99       85    0.67       90    1.04       89               No      No ---------------------------------------------------------------------- Impression  Antenatal testing due to IUGR with an EFW 10th% and  normal AC  Biophysical profile 8/8 with good fetal movement and mild  polyhydramnios again noted today  UA Dopplers are normal with no evidence of AEDF or REDF ---------------------------------------------------------------------- Recommendations  Repeat BPP UA Dopplers in 1 week.  ----------------------------------------------------------------------               Lin Landsman, MD Electronically Signed Final Report   09/29/2019 10:43 am ----------------------------------------------------------------------  [place] MFM FETAL BPP WO NON STRESS  Result Date: 09/22/2019 ----------------------------------------------------------------------  OBSTETRICS REPORT                       (Signed Final 09/22/2019 04:47 pm) ---------------------------------------------------------------------- Patient Info  ID #:       [id number]                          D.O.B.:  March 12, 2000 (19 yrs)  Name:       Cassandra Bradley                     Visit Date: 09/22/2019 04:04 pm ---------------------------------------------------------------------- Performed By  Attending:  Ma Rings MD         Ref. Address:     Faculty  Performed By:     Eden Lathe BS      [facility]                                 [facility]  Referred By:      Catalina Antigua MD ---------------------------------------------------------------------- Orders  #  Description                           Code        Ordered By  1  [place] MFM FETAL BPP WO NON               76819.01    CORENTHIAN     STRESS                                            BOOKER  2  [place] MFM OB FOLLOW UP                   76816.01    Lin Landsman  3  [place] MFM UA CORD DOPPLER                42353.61    Lin Landsman ----------------------------------------------------------------------  #  Order #                     Accession #                Episode #  1  [id number]  [id number]                 [id number]  2  [id number]                    [id number]                 [id number]  3  [id number]                   [id number]                 [id number] ---------------------------------------------------------------------- Indications  Encounter for other antenatal screening        Z36.2  follow-up  Maternal care for known or suspected poor      O36.5930  fetal growth, third trimester, not applicable or  unspecified IUGR  Pyelectasis of fetus on prenatal ultrasound    O28.3  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  Low Risk NIPS (Negative AFP)  [redacted] weeks gestation of pregnancy                Z3A.36 ---------------------------------------------------------------------- Fetal Evaluation  Num Of Fetuses:         1  Fetal Heart Rate(bpm):  144  Cardiac Activity:       Observed  Presentation:           Cephalic  Amniotic Fluid  AFI FV:      Polyhydramnios  AFI Sum(cm)     %Tile       Largest Pocket(cm)  28.2            > 97        8.6  RUQ(cm)       RLQ(cm)       LUQ(cm)        LLQ(cm)  8.1           5.8           5.7            8.6 ---------------------------------------------------------------------- Biometry  BPD:      83.6  mm     G. Age:  33w 5d        4.8  %    CI:         68.7   %    70 - 86                                                          FL/HC:      19.6   %    20.1 - 22.1  HC:      322.3  mm     G. Age:  36w 3d         26  %    HC/AC:      1.05        0.93 - 1.11  AC:      307.1  mm     G. Age:  34w 5d         20  %    FL/BPD:     75.6   %    71 - 87  FL:       63.2  mm     G. Age:  32w 5d        < 1  %  FL/AC:      20.6   %    20 - 24  HUM:      54.5  mm     G. Age:  31w 5d        < 5  %  Est. FW:    2372  gm      5 lb 4 oz     10  % ---------------------------------------------------------------------- OB History  Gravidity:    1 ---------------------------------------------------------------------- Gestational Age  LMP:           35w 0d        Date:  01/20/19                 EDD:   10/27/19  U/S Today:     34w 3d                                         EDD:   10/31/19  Best:          36w 1d     Det. ByMarcella Dubs         EDD:   10/19/19                                      (02/26/19) ---------------------------------------------------------------------- Anatomy  Cranium:               Appears normal         Aortic Arch:            Previously seen  Cavum:                 Previously seen        Ductal Arch:            Previously seen  Ventricles:            Previously seen        Diaphragm:              Previously seen  Choroid Plexus:        Previously seen        Stomach:                Appears normal, left                                                                        sided  Cerebellum:            Previously seen        Abdomen:                Appears normal  Posterior Fossa:       Previously seen        Abdominal Wall:         Previously seen  Nuchal Fold:           Not applicable (>20    Cord Vessels:           Previously seen  wks GA)  Face:                  Appears normal         Kidneys:                Left UTD 7.6 mm                         (orbits and profile)  Lips:                  Appears normal         Bladder:                Appears normal  Thoracic:              Appears normal         Spine:                  Previously seen  Heart:                 Appears normal         Upper Extremities:      Previously seen                         (4CH, axis, and                         situs)  RVOT:                  Appears normal         Lower Extremities:      Previously seen  LVOT:                  Appears normal ---------------------------------------------------------------------- Doppler - Fetal Vessels  Umbilical Artery   S/D     %tile      RI    %tile                             ADFV    RDFV   2.71       70    0.63       75                                No      No ---------------------------------------------------------------------- Comments  This patient was seen due to an IUGR fetus.  She  denies any  problems since her last exam.  She reports feeling vigorous  fetal movements throughout the day.  The overall EFW obtained today measures at the 10th  percentile for her gestational age (5 pounds 4 ounces).  Polyhydramnios with a total AFI of 28.2 cm is noted today.  Doppler studies of the umbilical arteries performed due to  fetal growth restriction showed a normal S/D ratio of 2.71.  There were no signs of absent or reversed end-diastolic flow  noted today.  Only mild left pyelectasis measuring 0.7 cm was noted today.  The right fetal kidney appeared within normal limits.  Due to borderline IUGR with normal fetal testing and  umbilical artery Doppler studies, delivery is recommended at  between 38 to 39 weeks.  We will continue to follow her with weekly fetal testing and  umbilical artery Doppler studies until  delivery.  Another exam was scheduled in 1 week. ----------------------------------------------------------------------                   Ma Rings, MD Electronically Signed Final Report   09/22/2019 04:47 pm ----------------------------------------------------------------------  [place] MFM FETAL BPP WO NON STRESS  Result Date: 09/15/2019 ----------------------------------------------------------------------  OBSTETRICS REPORT                       (Signed Final 09/15/2019 05:23 pm) ---------------------------------------------------------------------- Patient Info  ID #:       [id number]                          D.O.B.:  2000/09/07 (19 yrs)  Name:       Cassandra Bradley                     Visit Date: 09/15/2019 10:41 am ---------------------------------------------------------------------- Performed By  Attending:        Ma Rings MD         Ref. Address:     Faculty  Performed By:     Lenise Arena        [facility]                                     [facility]  Referred By:      Catalina Antigua MD ---------------------------------------------------------------------- Orders  #  Description                           Code        Ordered By  1  [place] MFM FETAL BPP WO NON               76819.01    CORENTHIAN     STRESS                                            BOOKER  2  [place] MFM UA CORD DOPPLER                76820.02    CORENTHIAN  BOOKER ----------------------------------------------------------------------  #  Order #                     Accession #                Episode #  1  [id number]                   [id number]                 [id number]  2  [id number]                   [id number]                 [id number] ---------------------------------------------------------------------- Indications  Maternal care for known or suspected poor      O36.5930  fetal growth, third trimester, not applicable or  unspecified IUGR  Pyelectasis of fetus on prenatal ultrasound    O28.3  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  Low Risk NIPS (Negative AFP)  Encounter for other antenatal screening        Z36.2  follow-up  [redacted] weeks gestation of pregnancy                Z3A.35 ---------------------------------------------------------------------- Fetal Evaluation  Num Of Fetuses:         1  Fetal Heart Rate(bpm):  144  Cardiac Activity:       Observed  Presentation:           Cephalic  Placenta:               Anterior  P. Cord Insertion:      Previously Visualized  Amniotic Fluid  AFI FV:      Within normal limits  AFI Sum(cm)     %Tile       Largest Pocket(cm)  16.94           62          5.64  RUQ(cm)       RLQ(cm)       LUQ(cm)        LLQ(cm)  5.64          3.88          4.35           3.07 ---------------------------------------------------------------------- Biophysical Evaluation  Amniotic F.V:   Within normal limits       F. Tone:        Observed  F.  Movement:    Observed                   Score:          8/8  F. Breathing:   Observed ---------------------------------------------------------------------- OB History  Gravidity:    1 ---------------------------------------------------------------------- Gestational Age  LMP:           34w 0d        Date:  01/20/19                 EDD:   10/27/19  Best:          35w 1d     Det. ByMarcella Dubs         EDD:   10/19/19                                      (02/26/19) ---------------------------------------------------------------------- Anatomy  Thoracic:              Appears normal         Stomach:                Appears normal, left                                                                        sided  Heart:                 Appears normal         Kidneys:                Bilat Pyelectasis                         (4CH, axis, and                         situs)  LVOT:                  Appears normal         Bladder:                Appears normal ---------------------------------------------------------------------- Doppler - Fetal Vessels  Umbilical Artery   S/D     %tile                                              ADFV    RDFV   3.21       87                                                 No      No ---------------------------------------------------------------------- Cervix Uterus Adnexa  Cervix  Not visualized (advanced GA >24wks) ---------------------------------------------------------------------- Comments  This patient was seen due to an IUGR fetus.  She denies any  problems since her last exam.  She reports feeling vigorous  fetal movements throughout the day.  A biophysical profile performed today was 8 out of 8.  There was normal amniotic fluid noted on today's ultrasound  exam.  Doppler studies of the umbilical arteries performed due to  fetal growth restriction showed a normal S/D ratio of 3.21.  There were no signs of absent or reversed end-diastolic flow  noted today.  Mild bilateral  pyelectasis (measuring 0.7 to 0.8 cm dilated)  continues to be noted today.  The patient was advised to  notify her pediatrician regarding the bilateral pyelectasis that  was noted during her prenatal ultrasound exams.  Her  pediatrician will order further imaging studies of the baby's  kidneys after birth if necessary.  A follow-up exam was scheduled in 1 week. ----------------------------------------------------------------------                   Ma Rings, MD Electronically Signed Final Report   09/15/2019 05:23 pm ----------------------------------------------------------------------  [place] MFM FETAL BPP WO NON  STRESS  Result Date: 09/07/2019 ----------------------------------------------------------------------  OBSTETRICS REPORT                       (Signed Final 09/07/2019 02:30 pm) ---------------------------------------------------------------------- Patient Info  ID #:       [id number]                          D.O.B.:  22-Dec-2000 (19 yrs)  Name:       Cassandra Bradley                     Visit Date: 09/07/2019 01:51 pm ---------------------------------------------------------------------- Performed By  Attending:        Ma Rings MD         Ref. Address:     Faculty  Performed By:     Sandi Mealy        [facility]                                     [facility]  Referred By:      Catalina Antigua MD ---------------------------------------------------------------------- Orders  #  Description                           Code        Ordered By  1  [place] MFM FETAL BPP WO NON               76819.01    RAVI SHANKAR     STRESS  2  [place] MFM OB FOLLOW UP                   76816.01    RAVI SHANKAR  3  [place] MFM UA CORD DOPPLER                40981.19    [facility]  ----------------------------------------------------------------------  #  Order #                     Accession #                Episode #  1  [id number]                   [id number]  [id number]  2  [id number]                   [id number]                 [id number]  3  [id number]                   [id number]                 [id number] ---------------------------------------------------------------------- Indications  Maternal care for known or suspected poor      O36.5930  fetal growth, third trimester, not applicable or  unspecified IUGR  Pyelectasis of fetus on prenatal ultrasound    O28.3  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 Cord Insertion:      Visualized  Amniotic Fluid  AFI FV:      Within normal limits  AFI Sum(cm)     %Tile       Largest Pocket(cm)  21.3            80          7.48  RUQ(cm)       RLQ(cm)       LUQ(cm)        LLQ(cm)  2.36          5.13          6.33           7.48 ---------------------------------------------------------------------- Biophysical Evaluation  Amniotic F.V:   Within normal limits       F. Tone:        Observed  F. Movement:    Observed                   Score:          8/8  F. Breathing:   Observed ---------------------------------------------------------------------- Biometry  BPD:      83.1  mm     G. Age:  33w 3d         31  %    CI:        72.35   %    70 - 86                                                          FL/HC:      20.0   %    19.4 - 21.8  HC:      310.8  mm     G. Age:  34w 5d         32  %    HC/AC:      1.07        0.96 - 1.11  AC:       291.8  mm     G. Age:  33w 1d         29  %    FL/BPD:     75.0   %    71 - 87  FL:       62.3  mm     G. Age:  32w 2d          7  %    FL/AC:      21.4   %    20 - 24  HUM:      55.4  mm     G. Age:  32w 2d         21  %  Est. FW:    2118  gm    4 lb 11 oz      20  % ---------------------------------------------------------------------- OB History  Gravidity:    1 ---------------------------------------------------------------------- Gestational Age  LMP:           32w 6d        Date:  01/20/19                 EDD:   10/27/19  U/S Today:     33w 3d                                        EDD:   10/23/19  Best:          34w 0d     Det. ByMarcella Dubs         EDD:   10/19/19                                      (02/26/19) ---------------------------------------------------------------------- Anatomy  Cranium:               Appears normal         Aortic Arch:            Previously seen  Cavum:                 Previously seen        Ductal Arch:            Previously seen  Ventricles:            Previously seen        Diaphragm:              Appears normal  Choroid Plexus:        Previously seen        Stomach:                Appears normal, left                                                                        sided  Cerebellum:            Previously seen        Abdomen:  Appears normal  Posterior Fossa:       Previously seen        Abdominal Wall:         Previously seen  Nuchal Fold:           Not applicable (>20    Cord Vessels:           Previously seen                         wks GA)  Face:                  Orbits and profile     Kidneys:                Bilat Pyelectasis                         previously seen                                                                        Rt 8mm, Lt 7mm  Lips:                  Previously seen        Bladder:                Appears normal  Thoracic:              Appears normal         Spine:                  Previously seen  Heart:                  Appears normal         Upper Extremities:      Previously seen                         (4CH, axis, and                         situs)  RVOT:                  Appears normal         Lower Extremities:      Previously seen  LVOT:                  Appears normal ---------------------------------------------------------------------- Doppler - Fetal Vessels  Umbilical Artery   S/D     %tile                                              ADFV    RDFV   2.65       57  No      No ---------------------------------------------------------------------- Comments  This patient was seen for a follow up growth scan due to fetal  growth restriction noted during her prior ultrasound exams.  She denies any problems since her last exam and reports  feeling vigorous fetal movements throughout the day.  On today's exam, the fetal growth appears appropriate for her  gestational age.  There was normal amniotic fluid noted.  A biophysical profile performed today due to fetal growth  restriction was 8 out of 8.  Doppler studies of the umbilical arteries showed a normal  S/D ratio of 2.65.  There were no signs of absent or reversed  end-diastolic flow.  Mild bilateral pyelectasis measuring 0.7 to 0.8 cm continues  to be noted today.  The patient was advised that her baby will  need further imaging studies after birth to determine if any  treatment will be necessary for the pyelectasis.  We will continue to follow her with weekly fetal testing.  Another biophysical profile was scheduled in 1 week. ----------------------------------------------------------------------                   Ma Rings, MD Electronically Signed Final Report   09/07/2019 02:30 pm ----------------------------------------------------------------------  [place] MFM OB FOLLOW UP  Result Date: 09/22/2019 ----------------------------------------------------------------------  OBSTETRICS REPORT                       (Signed Final  09/22/2019 04:47 pm) ---------------------------------------------------------------------- Patient Info  ID #:       [id number]                          D.O.B.:  2000/12/30 (19 yrs)  Name:       Cassandra Bradley                     Visit Date: 09/22/2019 04:04 pm ---------------------------------------------------------------------- Performed By  Attending:        Ma Rings MD         Ref. Address:     Faculty  Performed By:     Eden Lathe BS      [facility]T                                 [facility]  Referred By:      Gigi Gin                    CONSTANT MD ---------------------------------------------------------------------- Orders  #  Description                           Code        Ordered By  1  [place] MFM FETAL BPP WO NON               76819.01    CORENTHIAN     STRESS                                            BOOKER  2  [place] MFM OB FOLLOW UP                   76816.01    Lin Landsman  3  [place] MFM UA CORD DOPPLER                16109.60    Lin Landsman ----------------------------------------------------------------------  #  Order #                     Accession #                Episode #  1  [id number]                   [id number]                 [id number]  2  [id number]                   [id number]                 [id number]  3  [id number]                   [id number]                 [id number] ---------------------------------------------------------------------- Indications  Encounter for other antenatal screening        Z36.2  follow-up  Maternal care for known or suspected poor      O36.5930  fetal growth, third trimester, not applicable or  unspecified IUGR  Pyelectasis of fetus on prenatal ultrasound     O28.3  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  Low Risk NIPS (Negative AFP)  [redacted] weeks gestation of pregnancy                Z3A.36 ---------------------------------------------------------------------- Fetal Evaluation  Num Of Fetuses:         1  Fetal Heart Rate(bpm):  144  Cardiac Activity:       Observed  Presentation:           Cephalic  Amniotic Fluid  AFI FV:      Polyhydramnios  AFI Sum(cm)     %  Tile       Largest Pocket(cm)  28.2            > 97        8.6  RUQ(cm)       RLQ(cm)       LUQ(cm)        LLQ(cm)  8.1           5.8           5.7            8.6 ---------------------------------------------------------------------- Biometry  BPD:      83.6  mm     G. Age:  33w 5d        4.8  %    CI:         68.7   %    70 - 86                                                          FL/HC:      19.6   %    20.1 - 22.1  HC:      322.3  mm     G. Age:  36w 3d         26  %    HC/AC:      1.05        0.93 - 1.11  AC:      307.1  mm     G. Age:  34w 5d         20  %    FL/BPD:     75.6   %    71 - 87  FL:       63.2  mm     G. Age:  32w 5d        < 1  %    FL/AC:      20.6   %    20 - 24  HUM:      54.5  mm     G. Age:  31w 5d        < 5  %  Est. FW:    2372  gm      5 lb 4 oz     10  % ---------------------------------------------------------------------- OB History  Gravidity:    1 ---------------------------------------------------------------------- Gestational Age  LMP:           35w 0d        Date:  01/20/19                 EDD:   10/27/19  U/S Today:     34w 3d                                        EDD:   10/31/19  Best:          36w 1d     Det. ByMarcella Dubs         EDD:   10/19/19                                      (  02/26/19) ---------------------------------------------------------------------- Anatomy  Cranium:               Appears normal         Aortic Arch:            Previously seen  Cavum:                 Previously seen        Ductal Arch:            Previously seen   Ventricles:            Previously seen        Diaphragm:              Previously seen  Choroid Plexus:        Previously seen        Stomach:                Appears normal, left                                                                        sided  Cerebellum:            Previously seen        Abdomen:                Appears normal  Posterior Fossa:       Previously seen        Abdominal Wall:         Previously seen  Nuchal Fold:           Not applicable (>20    Cord Vessels:           Previously seen                         wks GA)  Face:                  Appears normal         Kidneys:                Left UTD 7.6 mm                         (orbits and profile)  Lips:                  Appears normal         Bladder:                Appears normal  Thoracic:              Appears normal         Spine:                  Previously seen  Heart:                 Appears normal         Upper Extremities:      Previously seen                         (4CH, axis, and  situs)  RVOT:                  Appears normal         Lower Extremities:      Previously seen  LVOT:                  Appears normal ---------------------------------------------------------------------- Doppler - Fetal Vessels  Umbilical Artery   S/D     %tile      RI    %tile                             ADFV    RDFV   2.71       70    0.63       75                                No      No ---------------------------------------------------------------------- Comments  This patient was seen due to an IUGR fetus.  She denies any  problems since her last exam.  She reports feeling vigorous  fetal movements throughout the day.  The overall EFW obtained today measures at the 10th  percentile for her gestational age (5 pounds 4 ounces).  Polyhydramnios with a total AFI of 28.2 cm is noted today.  Doppler studies of the umbilical arteries performed due to  fetal growth restriction showed a normal S/D ratio of 2.71.  There were no signs  of absent or reversed end-diastolic flow  noted today.  Only mild left pyelectasis measuring 0.7 cm was noted today.  The right fetal kidney appeared within normal limits.  Due to borderline IUGR with normal fetal testing and  umbilical artery Doppler studies, delivery is recommended at  between 38 to 39 weeks.  We will continue to follow her with weekly fetal testing and  umbilical artery Doppler studies until delivery.  Another exam was scheduled in 1 week. ----------------------------------------------------------------------                   Ma Rings, MD Electronically Signed Final Report   09/22/2019 04:47 pm ----------------------------------------------------------------------  [place] MFM OB FOLLOW UP  Result Date: 09/07/2019 ----------------------------------------------------------------------  OBSTETRICS REPORT                       (Signed Final 09/07/2019 02:30 pm) ---------------------------------------------------------------------- Patient Info  ID #:       [id number]                          D.O.B.:  12-11-2000 (19 yrs)  Name:       Cassandra Bradley                     Visit Date: 09/07/2019 01:51 pm ---------------------------------------------------------------------- Performed By  Attending:        Ma Rings MD         Ref. Address:     Faculty  Performed By:     Sandi Mealy        Location[facility]  Referred By:      Gigi Gin                    CONSTANT MD ---------------------------------------------------------------------- Orders  #  Description                           Code        Ordered By  1  [place] MFM FETAL BPP WO NON               76819.01    RAVI SHANKAR     STRESS  2  [place] MFM OB FOLLOW UP                   76816.01    RAVI SHANKAR  3  [place] MFM UA CORD DOPPLER                 76820.02    [facility] ----------------------------------------------------------------------  #  Order #                     Accession #                Episode #  1  [id number]                   [id number]                 [id number]  2  [id number]                   [id number]                 [id number]  3  [id number]                   [id number]                 [id number] ---------------------------------------------------------------------- Indications  Maternal care for known or suspected poor      O36.5930  fetal growth, third trimester, not applicable or  unspecified IUGR  Pyelectasis of fetus on prenatal ultrasound    O28.3  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 Cord Insertion:      Visualized  Amniotic Fluid  AFI FV:      Within normal limits  AFI Sum(cm)     %Tile       Largest Pocket(cm)  21.3            80          7.48  RUQ(cm)       RLQ(cm)       LUQ(cm)        LLQ(cm)  2.36          5.13          6.33           7.48 ---------------------------------------------------------------------- Biophysical Evaluation  Amniotic F.V:   Within normal limits       F. Tone:        Observed  F. Movement:    Observed                   Score:          8/8  F. Breathing:   Observed ---------------------------------------------------------------------- Biometry  BPD:      83.1  mm     G. Age:  33w 3d         31  %    CI:        72.35   %    70 - 86                                                          FL/HC:      20.0   %    19.4 - 21.8  HC:      310.8  mm     G. Age:  34w 5d         32  %    HC/AC:       1.07        0.96 - 1.11  AC:      291.8  mm     G. Age:  33w 1d         29  %    FL/BPD:     75.0   %    71 - 87  FL:       62.3  mm     G. Age:  32w 2d          7  %    FL/AC:      21.4   %    20 - 24  HUM:      55.4  mm     G. Age:  32w 2d         21  %  Est. FW:    2118  gm    4 lb 11 oz      20  % ---------------------------------------------------------------------- OB History  Gravidity:    1 ---------------------------------------------------------------------- Gestational Age  LMP:           32w 6d        Date:  01/20/19                 EDD:   10/27/19  U/S Today:     33w 3d  EDD:   10/23/19  Best:          34w 0d     Det. ByMarcella Dubs:  Early Ultrasound         EDD:   10/19/19                                      (02/26/19) ---------------------------------------------------------------------- Anatomy  Cranium:               Appears normal         Aortic Arch:            Previously seen  Cavum:                 Previously seen        Ductal Arch:            Previously seen  Ventricles:            Previously seen        Diaphragm:              Appears normal  Choroid Plexus:        Previously seen        Stomach:                Appears normal, left                                                                        sided  Cerebellum:            Previously seen        Abdomen:                Appears normal  Posterior Fossa:       Previously seen        Abdominal Wall:         Previously seen  Nuchal Fold:           Not applicable (>20    Cord Vessels:           Previously seen                         wks GA)  Face:                  Orbits and profile     Kidneys:                Bilat Pyelectasis                         previously seen                                                                        Rt 8mm, Lt 7mm  Lips:  Previously seen        Bladder:                Appears normal  Thoracic:              Appears normal         Spine:                   Previously seen  Heart:                 Appears normal         Upper Extremities:      Previously seen                         (4CH, axis, and                         situs)  RVOT:                  Appears normal         Lower Extremities:      Previously seen  LVOT:                  Appears normal ---------------------------------------------------------------------- Doppler - Fetal Vessels  Umbilical Artery   S/D     %tile                                              ADFV    RDFV   2.65       57                                                 No      No ---------------------------------------------------------------------- Comments  This patient was seen for a follow up growth scan due to fetal  growth restriction noted during her prior ultrasound exams.  She denies any problems since her last exam and reports  feeling vigorous fetal movements throughout the day.  On today's exam, the fetal growth appears appropriate for her  gestational age.  There was normal amniotic fluid noted.  A biophysical profile performed today due to fetal growth  restriction was 8 out of 8.  Doppler studies of the umbilical arteries showed a normal  S/D ratio of 2.65.  There were no signs of absent or reversed  end-diastolic flow.  Mild bilateral pyelectasis measuring 0.7 to 0.8 cm continues  to be noted today.  The patient was advised that her baby will  need further imaging studies after birth to determine if any  treatment will be necessary for the pyelectasis.  We will continue to follow her with weekly fetal testing.  Another biophysical profile was scheduled in 1 week. ----------------------------------------------------------------------                   Ma Rings, MD Electronically Signed Final Report   09/07/2019 02:30 pm ----------------------------------------------------------------------  [place] MFM UA CORD DOPPLER  Result Date: 09/29/2019 ----------------------------------------------------------------------  OBSTETRICS  REPORT                       (Signed Final 09/29/2019 10:43 am) ---------------------------------------------------------------------- Patient  Info  ID #:       [id number]                          D.O.B.:  2000-09-18 (19 yrs)  Name:       Cassandra Bradley                     Visit Date: 09/29/2019 10:24 am ---------------------------------------------------------------------- Performed By  Attending:        Lin Landsman      Ref. Address:     Faculty                    MD  Performed By:     Eden Lathe BS      Location[facility]                    RD[facility] RVT                                 [facility]  Referred By:      Catalina Antigua MD ---------------------------------------------------------------------- Orders  #  Description                           Code        Ordered By  1  [place] MFM FETAL BPP WO NON               76819.01    CORENT[place]AN     STRESS                                            BOOKER  2  [place] MFM UA CORD DOPPLER                40981.19    Lin Landsman ----------------------------------------------------------------------  #  Order #                     Accession #  Episode #  1  [id number]                   [id number]                 [id number]  2  [id number]                   [id number]                 [id number] ---------------------------------------------------------------------- Indications  Encounter for other antenatal screening        Z36.2  follow-up  Maternal care for known or suspected poor      O36.5930  fetal growth, third trimester, not applicable or  unspecified IUGR  Polyhydramnios, third trimester, antepartum    O40.3XX0  condition or complication, unspecified fetus  Pyelectasis of fetus on prenatal ultrasound    O28.3  Genetic carrier  (silent carrier for alpha thal)Z14.8  Low Risk NIPS (Negative AFP)  [redacted] weeks gestation of pregnancy                Z3A.37 ---------------------------------------------------------------------- Fetal Evaluation  Num Of Fetuses:         1  Fetal Heart Rate(bpm):  125  Cardiac Activity:       Observed  Presentation:           Cephalic  Amniotic Fluid  AFI FV:      Mild Polyhydramnios  AFI Sum(cm)     %Tile       Largest Pocket(cm)  28.1            > 97        9.6  RUQ(cm)       RLQ(cm)       LUQ(cm)        LLQ(cm)  9.6           6.5           6.7            5.3 ---------------------------------------------------------------------- Biophysical Evaluation  Amniotic F.V:   Polyhydramnios             F. Tone:        Observed  F. Movement:    Observed                   Score:          8/8  F. Breathing:   Observed ---------------------------------------------------------------------- OB History  Gravidity:    1 ---------------------------------------------------------------------- Gestational Age  LMP:           36w 0d        Date:  01/20/19                 EDD:   10/27/19  Best:          37w 1d     Det. ByMarcella Dubs         EDD:   10/19/19                                      (02/26/19) ---------------------------------------------------------------------- Doppler - Fetal Vessels  Umbilical Artery   S/D     %tile      RI    %tile      PI    %tile            ADFV    RDFV  2.99       85    0.67       90    1.04       89               No      No ---------------------------------------------------------------------- Impression  Antenatal testing due to IUGR with an EFW 10th% and  normal AC  Biophysical profile 8/8 with good fetal movement and mild  polyhydramnios again noted today  UA Dopplers are normal with no evidence of AEDF or REDF ---------------------------------------------------------------------- Recommendations  Repeat BPP UA Dopplers in 1 week.  ----------------------------------------------------------------------               Lin Landsman, MD Electronically Signed Final Report   09/29/2019 10:43 am ----------------------------------------------------------------------  [place] MFM UA CORD DOPPLER  Result Date: 09/22/2019 ----------------------------------------------------------------------  OBSTETRICS REPORT                       (Signed Final 09/22/2019 04:47 pm) ---------------------------------------------------------------------- Patient Info  ID #:       [id number]                          D.O.B.:  22-Jan-2000 (19 yrs)  Name:       Cassandra Bradley                     Visit Date: 09/22/2019 04:04 pm ---------------------------------------------------------------------- Performed By  Attending:        Ma Rings MD         Ref. Address:     Faculty  Performed By:     Eden Lathe BS      Location[facility]                    RDMS RVT                                 [facility]  Referred By:      Catalina Antigua MD ---------------------------------------------------------------------- Orders  #  Description                           Code        Ordered By  1  [place] MFM FETAL BPP WO NON               40981.19    [place]AN     STRESS  BOOKER  2  [place] MFM OB FOLLOW UP                   [id number]    Lin Landsman  3  [place] MFM UA CORD DOPPLER                16109.60    Lin Landsman ----------------------------------------------------------------------  #  Order #                     Accession #                Episode #  1  [id number]                   [id number]                 [id number]  2  [id number]                   [id number]                  [id number]  3  [id number]                   [id number]                 [id number] ---------------------------------------------------------------------- Indications  Encounter for other antenatal screening        Z36.2  follow-up  Maternal care for known or suspected poor      O36.5930  fetal growth, third trimester, not applicable or  unspecified IUGR  Pyelectasis of fetus on prenatal ultrasound    O28.3  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  Low Risk NIPS (Negative AFP)  [redacted] weeks gestation of pregnancy                Z3A.36 ---------------------------------------------------------------------- Fetal Evaluation  Num Of Fetuses:         1  Fetal Heart Rate(bpm):  144  Cardiac Activity:       Observed  Presentation:           Cephalic  Amniotic Fluid  AFI FV:      Polyhydramnios  AFI Sum(cm)     %Tile       Largest Pocket(cm)  28.2            > 97        8.6  RUQ(cm)       RLQ(cm)       LUQ(cm)        LLQ(cm)  8.1           5.8           5.7            8.6 ---------------------------------------------------------------------- Biometry  BPD:      83.6  mm     G. Age:  33w 5d        4.8  %    CI:         68.7   %    70 - 86                                                          FL/HC:      19.6   %    20.1 - 22.1  HC:      322.3  mm     G. Age:  36w 3d         26  %    HC/AC:      1.05        0.93 - 1.11  AC:      307.1  mm     G. Age:  34w 5d         20  %    FL/BPD:     75.6   %    71 - 87  FL:       63.2  mm     G. Age:  32w 5d        < 1  %    FL/AC:      20.6   %    20 - 24  HUM:      54.5  mm     G. Age:  31w 5d        < 5  %  Est. FW:    2372  gm      5 lb 4 oz     10  % ---------------------------------------------------------------------- OB History  Gravidity:    1 ---------------------------------------------------------------------- Gestational Age  LMP:           35w 0d        Date:  01/20/19                 EDD:   10/27/19  U/S Today:     34w 3d                                         EDD:   10/31/19  Best:          36w 1d     Det. By:  Marcella Dubs         EDD:   10/19/19                                      (02/26/19) ---------------------------------------------------------------------- Anatomy  Cranium:               Appears normal         Aortic Arch:            Previously seen  Cavum:                 Previously seen        Ductal Arch:            Previously seen  Ventricles:            Previously seen        Diaphragm:  Previously seen  Choroid Plexus:        Previously seen        Stomach:                Appears normal, left                                                                        sided  Cerebellum:            Previously seen        Abdomen:                Appears normal  Posterior Fossa:       Previously seen        Abdominal Wall:         Previously seen  Nuchal Fold:           Not applicable (>20    Cord Vessels:           Previously seen                         wks GA)  Face:                  Appears normal         Kidneys:                Left UTD 7.6 mm                         (orbits and profile)  Lips:                  Appears normal         Bladder:                Appears normal  Thoracic:              Appears normal         Spine:                  Previously seen  Heart:                 Appears normal         Upper Extremities:      Previously seen                         (4CH, axis, and                         situs)  RVOT:                  Appears normal         Lower Extremities:      Previously seen  LVOT:                  Appears normal ---------------------------------------------------------------------- Doppler - Fetal Vessels  Umbilical Artery   S/D     %tile      RI    %tile  ADFV    RDFV   2.71       70    0.63       75                                No      No ---------------------------------------------------------------------- Comments  This patient was seen due to an IUGR fetus.  She denies any  problems  since her last exam.  She reports feeling vigorous  fetal movements throughout the day.  The overall EFW obtained today measures at the 10th  percentile for her gestational age (5 pounds 4 ounces).  Polyhydramnios with a total AFI of 28.2 cm is noted today.  Doppler studies of the umbilical arteries performed due to  fetal growth restriction showed a normal S/D ratio of 2.71.  There were no signs of absent or reversed end-diastolic flow  noted today.  Only mild left pyelectasis measuring 0.7 cm was noted today.  The right fetal kidney appeared within normal limits.  Due to borderline IUGR with normal fetal testing and  umbilical artery Doppler studies, delivery is recommended at  between 38 to 39 weeks.  We will continue to follow her with weekly fetal testing and  umbilical artery Doppler studies until delivery.  Another exam was scheduled in 1 week. ----------------------------------------------------------------------                   Ma Rings, MD Electronically Signed Final Report   09/22/2019 04:47 pm ----------------------------------------------------------------------  [place] MFM UA CORD DOPPLER  Result Date: 09/15/2019 ----------------------------------------------------------------------  OBSTETRICS REPORT                       (Signed Final 09/15/2019 05:23 pm) ---------------------------------------------------------------------- Patient Info  ID #:       [id number]                          D.O.B.:  11-Feb-2000 (19 yrs)  Name:       Cassandra Bradley                     Visit Date: 09/15/2019 10:41 am ---------------------------------------------------------------------- Performed By  Attending:        Ma Rings MD         Ref. Address:     Faculty  Performed By:     Lenise Arena        [facility]                                                             [facility]  Referred By:      Gigi Gin                    CONSTANT MD ---------------------------------------------------------------------- Orders  #  Description                           Code        Ordered By  1  [place] MFM FETAL BPP WO NON               76819.01    CORENTHIAN     STRESS                                            BOOKER  2  [place] MFM UA CORD DOPPLER                76820.02    Lin Landsman ----------------------------------------------------------------------  #  Order #                     Accession #                Episode #  1  [id number]                   [id number]                 [id number]  2  [id number]                   [id number]                 [id number] ---------------------------------------------------------------------- Indications  Maternal care for known or suspected poor      O36.5930  fetal growth, third trimester, not applicable or  unspecified IUGR  Pyelectasis of fetus on prenatal ultrasound    O28.3  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  Low Risk NIPS (Negative AFP)  Encounter for other antenatal screening        Z36.2  follow-up  [redacted] weeks gestation of pregnancy                Z3A.35 ---------------------------------------------------------------------- Fetal Evaluation  Num Of Fetuses:         1  Fetal Heart Rate(bpm):  144  Cardiac Activity:       Observed  Presentation:           Cephalic  Placenta:               Anterior  P. Cord Insertion:      Previously Visualized  Amniotic Fluid  AFI FV:      Within normal limits  AFI Sum(cm)     %Tile       Largest Pocket(cm)  16.94           62          5.64  RUQ(cm)       RLQ(cm)       LUQ(cm)        LLQ(cm)  5.64  3.88          4.35           3.07 ---------------------------------------------------------------------- Biophysical Evaluation  Amniotic F.V:   Within normal limits       F. Tone:        Observed  F. Movement:    Observed                    Score:          8/8  F. Breathing:   Observed ---------------------------------------------------------------------- OB History  Gravidity:    1 ---------------------------------------------------------------------- Gestational Age  LMP:           34w 0d        Date:  01/20/19                 EDD:   10/27/19  Best:          35w 1d     Det. ByMarcella Dubs         EDD:   10/19/19                                      (02/26/19) ---------------------------------------------------------------------- Anatomy  Thoracic:              Appears normal         Stomach:                Appears normal, left                                                                        sided  Heart:                 Appears normal         Kidneys:                Bilat Pyelectasis                         (4CH, axis, and                         situs)  LVOT:                  Appears normal         Bladder:                Appears normal ---------------------------------------------------------------------- Doppler - Fetal Vessels  Umbilical Artery   S/D     %tile                                              ADFV    RDFV   3.21       87  No      No ---------------------------------------------------------------------- Cervix Uterus Adnexa  Cervix  Not visualized (advanced GA >24wks) ---------------------------------------------------------------------- Comments  This patient was seen due to an IUGR fetus.  She denies any  problems since her last exam.  She reports feeling vigorous  fetal movements throughout the day.  A biophysical profile performed today was 8 out of 8.  There was normal amniotic fluid noted on today's ultrasound  exam.  Doppler studies of the umbilical arteries performed due to  fetal growth restriction showed a normal S/D ratio of 3.21.  There were no signs of absent or reversed end-diastolic flow  noted today.  Mild bilateral pyelectasis (measuring 0.7 to 0.8 cm  dilated)  continues to be noted today.  The patient was advised to  notify her pediatrician regarding the bilateral pyelectasis that  was noted during her prenatal ultrasound exams.  Her  pediatrician will order further imaging studies of the baby's  kidneys after birth if necessary.  A follow-up exam was scheduled in 1 week. ----------------------------------------------------------------------                   Ma Rings, MD Electronically Signed Final Report   09/15/2019 05:23 pm ----------------------------------------------------------------------  [place] MFM UA CORD DOPPLER  Result Date: 09/07/2019 ----------------------------------------------------------------------  OBSTETRICS REPORT                       (Signed Final 09/07/2019 02:30 pm) ---------------------------------------------------------------------- Patient Info  ID #:       [id number]                          D.O.B.:  06/25/2000 (19 yrs)  Name:       Cassandra Bradley                     Visit Date: 09/07/2019 01:51 pm ---------------------------------------------------------------------- Performed By  Attending:        Ma Rings MD         Ref. Address:     Faculty  Performed By:     Sandi Mealy        [facility]  Referred By:      Gigi Gin                    CONSTANT MD ---------------------------------------------------------------------- Orders  #  Description                           Code        Ordered By  1  [place] MFM FETAL BPP WO NON               76819.01    RAVI SHANKAR     STRESS  2  [place] MFM OB FOLLOW UP                   76816.01    RAVI SHANKAR  3  [place] MFM UA CORD DOPPLER                76820.02    RAVI SHANKAR ----------------------------------------------------------------------  #  Order #                      Accession #                Episode #  1  [id number]                   [id number]                 [id number]  2  [id number]                   [id number]                 [id number]  3  [id number]                   [id number]                 [id number] ---------------------------------------------------------------------- Indications  Maternal care for known or suspected poor      O36.5930  fetal growth, third trimester, not applicable or  unspecified IUGR  Pyelectasis of fetus on prenatal ultrasound    O28.3  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 Cord Insertion:      Visualized  Amniotic Fluid  AFI FV:      Within normal limits  AFI Sum(cm)     %Tile       Largest Pocket(cm)  21.3            80          7.48  RUQ(cm)       RLQ(cm)       LUQ(cm)        LLQ(cm)  2.36          5.13          6.33           7.48 ---------------------------------------------------------------------- Biophysical Evaluation  Amniotic F.V:   Within normal limits       F. Tone:        Observed  F.  Movement:    Observed                   Score:          8/8  F. Breathing:   Observed ---------------------------------------------------------------------- Biometry  BPD:      83.1  mm     G. Age:  33w 3d         31  %    CI:        72.35   %    70 - 86                                                          FL/HC:      20.0   %    19.4 - 21.8  HC:      310.8  mm     G. Age:  34w 5d         32  %    HC/AC:      1.07        0.96 - 1.11  AC:      291.8  mm     G. Age:  33w 1d         29  %    FL/BPD:     75.0   %    71 - 87  FL:       62.3  mm      G. Age:  32w 2d          7  %    FL/AC:      21.4   %    20 - 24  HUM:      55.4  mm     G. Age:  32w 2d         21  %  Est. FW:    2118  gm    4 lb 11 oz      20  % ---------------------------------------------------------------------- OB History  Gravidity:    1 ---------------------------------------------------------------------- Gestational Age  LMP:           32w 6d        Date:  01/20/19                 EDD:   10/27/19  U/S Today:     33w 3d                                        EDD:   10/23/19  Best:          34w 0d     Det. ByMarcella Dubs         EDD:   10/19/19                                      (02/26/19) ---------------------------------------------------------------------- Anatomy  Cranium:               Appears normal         Aortic Arch:            Previously seen  Cavum:  Previously seen        Ductal Arch:            Previously seen  Ventricles:            Previously seen        Diaphragm:              Appears normal  Choroid Plexus:        Previously seen        Stomach:                Appears normal, left                                                                        sided  Cerebellum:            Previously seen        Abdomen:                Appears normal  Posterior Fossa:       Previously seen        Abdominal Wall:         Previously seen  Nuchal Fold:           Not applicable (>20    Cord Vessels:           Previously seen                         wks GA)  Face:                  Orbits and profile     Kidneys:                Bilat Pyelectasis                         previously seen                                                                        Rt 8mm, Lt 7mm  Lips:                  Previously seen        Bladder:                Appears normal  Thoracic:              Appears normal         Spine:                  Previously seen  Heart:                 Appears normal         Upper Extremities:      Previously seen                         (4CH, axis,  and                         situs)  RVOT:                  Appears normal         Lower Extremities:      Previously seen  LVOT:                  Appears normal ---------------------------------------------------------------------- Doppler - Fetal Vessels  Umbilical Artery   S/D     %tile                                              ADFV    RDFV   2.65       57                                                 No      No ---------------------------------------------------------------------- Comments  This patient was seen for a follow up growth scan due to fetal  growth restriction noted during her prior ultrasound exams.  She denies any problems since her last exam and reports  feeling vigorous fetal movements throughout the day.  On today's exam, the fetal growth appears appropriate for her  gestational age.  There was normal amniotic fluid noted.  A biophysical profile performed today due to fetal growth  restriction was 8 out of 8.  Doppler studies of the umbilical arteries showed a normal  S/D ratio of 2.65.  There were no signs of absent or reversed  end-diastolic flow.  Mild bilateral pyelectasis measuring 0.7 to 0.8 cm continues  to be noted today.  The patient was advised that her baby will  need further imaging studies after birth to determine if any  treatment will be necessary for the pyelectasis.  We will continue to follow her with weekly fetal testing.  Another biophysical profile was scheduled in 1 week. ----------------------------------------------------------------------                   Ma Rings, MD Electronically Signed Final Report   09/07/2019 02:30 pm ----------------------------------------------------------------------   Assessment and Plan:  Pregnancy: G1P0 at [redacted]w[redacted]d 1. Encounter for supervision of normal first pregnancy in first trimester  2. Anemia during pregnancy in third trimester   Term labor symptoms and general obstetric precautions including but not limited to vaginal  bleeding, contractions, leaking of fluid and fetal movement were reviewed in detail with the patient. I discussed the assessment and treatment plan with the patient. The patient was provided an opportunity to ask questions and all were answered. The patient agreed with the plan and demonstrated an understanding of the instructions. The patient was advised to call back or seek an in-person office evaluation/go to MAU at [facility] for any urgent or concerning symptoms. Please refer to After Visit Summary for other counseling recommendations.   I provided 10 minutes of face-to-face time during this encounter.  Return in about 1 week (around 10/09/2019) for MyChart.  Future Appointments  Date Time Provider Department Center  10/05/2019  1:30 PM [facility] NURSE [facility]  10/05/2019  1:45 PM [facility] US4 [facility]  [facility]    Coral Ceo, MD [facility] [employer], [facility] 10/02/19

## 2019-10-02 NOTE — Progress Notes (Signed)
Pt states she has some questions about labor and delivery.

## 2019-10-05 ENCOUNTER — Ambulatory Visit: Attending: Obstetrics

## 2019-10-05 ENCOUNTER — Ambulatory Visit: Admitting: *Deleted

## 2019-10-05 ENCOUNTER — Other Ambulatory Visit: Payer: Self-pay

## 2019-10-05 DIAGNOSIS — O365930 Maternal care for other known or suspected poor fetal growth, third trimester, not applicable or unspecified: Secondary | ICD-10-CM | POA: Insufficient documentation

## 2019-10-05 DIAGNOSIS — O283 Abnormal ultrasonic finding on antenatal screening of mother: Secondary | ICD-10-CM | POA: Diagnosis not present

## 2019-10-05 DIAGNOSIS — Z3401 Encounter for supervision of normal first pregnancy, first trimester: Secondary | ICD-10-CM | POA: Insufficient documentation

## 2019-10-05 DIAGNOSIS — O403XX0 Polyhydramnios, third trimester, not applicable or unspecified: Secondary | ICD-10-CM | POA: Diagnosis not present

## 2019-10-05 DIAGNOSIS — O99013 Anemia complicating pregnancy, third trimester: Secondary | ICD-10-CM | POA: Insufficient documentation

## 2019-10-05 DIAGNOSIS — Z3A38 38 weeks gestation of pregnancy: Secondary | ICD-10-CM

## 2019-10-05 DIAGNOSIS — Z362 Encounter for other antenatal screening follow-up: Secondary | ICD-10-CM | POA: Diagnosis not present

## 2019-10-09 ENCOUNTER — Telehealth (INDEPENDENT_AMBULATORY_CARE_PROVIDER_SITE_OTHER): Admitting: Advanced Practice Midwife

## 2019-10-09 DIAGNOSIS — O403XX0 Polyhydramnios, third trimester, not applicable or unspecified: Secondary | ICD-10-CM

## 2019-10-09 DIAGNOSIS — O365930 Maternal care for other known or suspected poor fetal growth, third trimester, not applicable or unspecified: Secondary | ICD-10-CM

## 2019-10-09 DIAGNOSIS — Z3A38 38 weeks gestation of pregnancy: Secondary | ICD-10-CM

## 2019-10-09 DIAGNOSIS — D649 Anemia, unspecified: Secondary | ICD-10-CM

## 2019-10-09 DIAGNOSIS — O0990 Supervision of high risk pregnancy, unspecified, unspecified trimester: Secondary | ICD-10-CM

## 2019-10-09 DIAGNOSIS — Z148 Genetic carrier of other disease: Secondary | ICD-10-CM

## 2019-10-09 DIAGNOSIS — O99013 Anemia complicating pregnancy, third trimester: Secondary | ICD-10-CM

## 2019-10-09 NOTE — Progress Notes (Addendum)
   TELEHEALTH OBSTETRICS VISIT ENCOUNTER NOTE  I connected with Cassandra Bradley on 10/09/19 at 10:55 AM EDT by telephone and verified that I am speaking with the correct person using two identifiers.  Telehealth visit was between patient, who was located at home, and provider who was located at [facility] office.   I discussed the limitations, risks, security and privacy concerns of performing an evaluation and management service by telephone and the availability of in person appointments. I also discussed with the patient that there may be a patient responsible charge related to this service. The patient expressed understanding and agreed to proceed.  Subjective:  Cassandra Bradley is a 19 y.o. G1P0 at [redacted]w[redacted]d being followed for ongoing prenatal care.  She is currently monitored for the following issues for this high-risk pregnancy and has Encounter for supervision of normal first pregnancy in first trimester; Alpha thalassemia silent carrier; Uterine size date discrepancy pregnancy; and Anemia in pregnancy on their problem list.  Patient reports no complaints. Reports fetal movement. Denies any contractions, bleeding or leaking of fluid.   The following portions of the patient's history were reviewed and updated as appropriate: allergies, current medications, past family history, past medical history, past social history, past surgical history and problem list.   Objective:   General:  Alert, oriented and cooperative.   Mental Status: Normal mood and affect perceived. Normal judgment and thought content.  Rest of physical exam deferred due to type of encounter  Assessment and Plan:  Pregnancy: G1P0 at [redacted]w[redacted]d 1. Supervision of high risk pregnancy, antepartum --Anticipatory guidance about next visits/weeks of pregnancy given. --IOL scheduled  10/7 or 10/8 per MFM --Virtual visit today, could not connect so visit done via telephone  2. Polyhydramnios affecting pregnancy in third trimester --Mild  polyhydramnios on [place] 10/05/19. IOL at 39 weeks per Dr Judeth Cornfield.  3. Poor fetal growth affecting management of mother in third trimester, single or unspecified fetus --FGR resolved on most recent US, but given polyhydramnios, IOL at 39 weeks  Term labor symptoms and general obstetric precautions including but not limited to vaginal bleeding, contractions, leaking of fluid and fetal movement were reviewed in detail with the patient.  I discussed the assessment and treatment plan with the patient. The patient was provided an opportunity to ask questions and all were answered. The patient agreed with the plan and demonstrated an understanding of the instructions. The patient was advised to call back or seek an in-person office evaluation/go to MAU at [facility] for any urgent or concerning symptoms. Please refer to After Visit Summary for other counseling recommendations.   I provided 10 minutes of non-face-to-face time during this encounter.  No follow-ups on file.  No future appointments.  Sharen Counter, CNM [facility] [employer], [facility]

## 2019-10-10 ENCOUNTER — Other Ambulatory Visit: Payer: Self-pay | Admitting: Advanced Practice Midwife

## 2019-10-10 ENCOUNTER — Encounter (HOSPITAL_COMMUNITY): Payer: Self-pay

## 2019-10-10 DIAGNOSIS — O403XX0 Polyhydramnios, third trimester, not applicable or unspecified: Secondary | ICD-10-CM | POA: Insufficient documentation

## 2019-10-11 ENCOUNTER — Encounter (HOSPITAL_COMMUNITY): Payer: Self-pay | Admitting: *Deleted

## 2019-10-11 ENCOUNTER — Telehealth (HOSPITAL_COMMUNITY): Payer: Self-pay | Admitting: *Deleted

## 2019-10-11 ENCOUNTER — Other Ambulatory Visit (HOSPITAL_COMMUNITY)
Admission: RE | Admit: 2019-10-11 | Discharge: 2019-10-11 | Disposition: A | Discharge status: Home / Self Care | Source: Ambulatory Visit | Attending: Family Medicine | Admitting: Family Medicine

## 2019-10-11 DIAGNOSIS — Z01812 Encounter for preprocedural laboratory examination: Secondary | ICD-10-CM | POA: Insufficient documentation

## 2019-10-11 DIAGNOSIS — Z20822 Contact with and (suspected) exposure to covid-19: Secondary | ICD-10-CM | POA: Insufficient documentation

## 2019-10-11 LAB — SARS CORONAVIRUS 2 (TAT 6-24 HRS): SARS Coronavirus 2: NEGATIVE

## 2019-10-11 NOTE — Telephone Encounter (Signed)
Preadmission screen  

## 2019-10-12 ENCOUNTER — Other Ambulatory Visit: Payer: Self-pay | Admitting: Advanced Practice Midwife

## 2019-10-12 ENCOUNTER — Inpatient Hospital Stay (HOSPITAL_COMMUNITY)
Admission: AD | Admit: 2019-10-12 | Discharge: 2019-10-16 | DRG: 807 | Disposition: A | Discharge status: Home / Self Care | Attending: Obstetrics and Gynecology | Admitting: Obstetrics and Gynecology

## 2019-10-12 ENCOUNTER — Encounter (HOSPITAL_COMMUNITY): Payer: Self-pay | Admitting: Obstetrics & Gynecology

## 2019-10-12 ENCOUNTER — Inpatient Hospital Stay (HOSPITAL_COMMUNITY)

## 2019-10-12 ENCOUNTER — Other Ambulatory Visit: Payer: Self-pay

## 2019-10-12 DIAGNOSIS — Z20822 Contact with and (suspected) exposure to covid-19: Secondary | ICD-10-CM | POA: Diagnosis present

## 2019-10-12 DIAGNOSIS — O403XX0 Polyhydramnios, third trimester, not applicable or unspecified: Secondary | ICD-10-CM | POA: Diagnosis present

## 2019-10-12 DIAGNOSIS — D563 Thalassemia minor: Secondary | ICD-10-CM | POA: Diagnosis present

## 2019-10-12 DIAGNOSIS — O365930 Maternal care for other known or suspected poor fetal growth, third trimester, not applicable or unspecified: Secondary | ICD-10-CM | POA: Diagnosis present

## 2019-10-12 DIAGNOSIS — Z3A39 39 weeks gestation of pregnancy: Secondary | ICD-10-CM | POA: Diagnosis not present

## 2019-10-12 DIAGNOSIS — Z3401 Encounter for supervision of normal first pregnancy, first trimester: Secondary | ICD-10-CM

## 2019-10-12 DIAGNOSIS — O358XX0 Maternal care for other (suspected) fetal abnormality and damage, not applicable or unspecified: Secondary | ICD-10-CM | POA: Diagnosis present

## 2019-10-12 DIAGNOSIS — O99013 Anemia complicating pregnancy, third trimester: Secondary | ICD-10-CM

## 2019-10-12 DIAGNOSIS — O99019 Anemia complicating pregnancy, unspecified trimester: Secondary | ICD-10-CM | POA: Diagnosis present

## 2019-10-12 DIAGNOSIS — O409XX0 Polyhydramnios, unspecified trimester, not applicable or unspecified: Secondary | ICD-10-CM | POA: Diagnosis present

## 2019-10-12 LAB — CBC
HCT: 39.4 % (ref 36.0–46.0)
Hemoglobin: 12.2 g/dL (ref 12.0–15.0)
MCH: 24.5 pg — ABNORMAL LOW (ref 26.0–34.0)
MCHC: 31 g/dL (ref 30.0–36.0)
MCV: 79.1 fL — ABNORMAL LOW (ref 80.0–100.0)
Platelets: 232 10*3/uL (ref 150–400)
RBC: 4.98 MIL/uL (ref 3.87–5.11)
RDW: 22.6 % — ABNORMAL HIGH (ref 11.5–15.5)
WBC: 6.7 10*3/uL (ref 4.0–10.5)
nRBC: 0 % (ref 0.0–0.2)

## 2019-10-12 LAB — TYPE AND SCREEN
ABO/RH(D): A POS
Antibody Screen: NEGATIVE

## 2019-10-12 LAB — RPR: RPR Ser Ql: NONREACTIVE

## 2019-10-12 MED ORDER — LACTATED RINGERS IV SOLN: 500.0000 mL | Freq: Once | INTRAVENOUS | Status: DC

## 2019-10-12 MED ORDER — MISOPROSTOL 50MCG HALF TABLET
50.0000 ug | ORAL_TABLET | ORAL | Status: DC | PRN
  Administered 2019-10-12 – 2019-10-13 (×3): 50 ug via ORAL
  Filled 2019-10-12 (×2): qty 1

## 2019-10-12 MED ORDER — LACTATED RINGERS IV SOLN: 500.0000 mL | INTRAVENOUS | Status: DC | PRN

## 2019-10-12 MED ORDER — MISOPROSTOL 50MCG HALF TABLET
ORAL_TABLET | ORAL | Status: AC
  Filled 2019-10-12: qty 1

## 2019-10-12 MED ORDER — LIDOCAINE HCL (PF) 1 % IJ SOLN: 30.0000 mL | INTRAMUSCULAR | Status: DC | PRN

## 2019-10-12 MED ORDER — TERBUTALINE SULFATE 1 MG/ML IJ SOLN: 0.2500 mg | Freq: Once | INTRAMUSCULAR | Status: DC | PRN

## 2019-10-12 MED ORDER — OXYTOCIN BOLUS FROM INFUSION
333.0000 mL | Freq: Once | INTRAVENOUS | Status: AC
  Administered 2019-10-14: 333 mL via INTRAVENOUS

## 2019-10-12 MED ORDER — EPHEDRINE 5 MG/ML INJ
10.0000 mg | INTRAVENOUS | Status: DC | PRN
  Filled 2019-10-12: qty 10

## 2019-10-12 MED ORDER — ZOLPIDEM TARTRATE 5 MG PO TABS: 5.0000 mg | ORAL_TABLET | Freq: Every evening | ORAL | Status: DC | PRN

## 2019-10-12 MED ORDER — SOD CITRATE-CITRIC ACID 500-334 MG/5ML PO SOLN: 30.0000 mL | ORAL | Status: DC | PRN

## 2019-10-12 MED ORDER — OXYCODONE-ACETAMINOPHEN 5-325 MG PO TABS: 2.0000 | ORAL_TABLET | ORAL | Status: DC | PRN

## 2019-10-12 MED ORDER — ONDANSETRON HCL 4 MG/2ML IJ SOLN
4.0000 mg | Freq: Four times a day (QID) | INTRAMUSCULAR | Status: DC | PRN
  Administered 2019-10-14: 4 mg via INTRAVENOUS
  Filled 2019-10-12: qty 2

## 2019-10-12 MED ORDER — PHENYLEPHRINE 40 MCG/ML (10ML) SYRINGE FOR IV PUSH (FOR BLOOD PRESSURE SUPPORT): 80.0000 ug | PREFILLED_SYRINGE | INTRAVENOUS | Status: DC | PRN

## 2019-10-12 MED ORDER — LACTATED RINGERS IV SOLN: INTRAVENOUS | Status: DC

## 2019-10-12 MED ORDER — ACETAMINOPHEN 325 MG PO TABS: 650.0000 mg | ORAL_TABLET | ORAL | Status: DC | PRN

## 2019-10-12 MED ORDER — OXYCODONE-ACETAMINOPHEN 5-325 MG PO TABS: 1.0000 | ORAL_TABLET | ORAL | Status: DC | PRN

## 2019-10-12 MED ORDER — DIPHENHYDRAMINE HCL 50 MG/ML IJ SOLN: 12.5000 mg | INTRAMUSCULAR | Status: DC | PRN

## 2019-10-12 MED ORDER — MISOPROSTOL 25 MCG QUARTER TABLET: 25.0000 ug | ORAL_TABLET | ORAL | Status: DC | PRN

## 2019-10-12 MED ORDER — OXYTOCIN-SODIUM CHLORIDE 30-0.9 UT/500ML-% IV SOLN
2.5000 [IU]/h | INTRAVENOUS | Status: DC
  Filled 2019-10-12: qty 500

## 2019-10-12 MED ORDER — EPHEDRINE 5 MG/ML INJ: 10.0000 mg | INTRAVENOUS | Status: DC | PRN

## 2019-10-12 MED ORDER — FENTANYL-BUPIVACAINE-NACL 0.5-0.125-0.9 MG/250ML-% EP SOLN
12.0000 mL/h | EPIDURAL | Status: DC | PRN
  Filled 2019-10-12: qty 250

## 2019-10-12 MED ORDER — FENTANYL CITRATE (PF) 100 MCG/2ML IJ SOLN: 100.0000 ug | INTRAMUSCULAR | Status: DC | PRN

## 2019-10-12 NOTE — H&P (Addendum)
OBSTETRIC ADMISSION HISTORY AND PHYSICAL  Cassandra Bradley is a 19 y.o. female G1P0 with IUP at 22w0dby [place] for IOL for polyhydramnios. She reports +FMs, No LOF, no VB, no blurry vision, headaches or peripheral edema, and RUQ pain.  She plans on breast feeding. She request depo for birth control. [facility]   Dating: By [place]--->  Estimated Date of Delivery: 10/19/19  Sono: 09/22/2019 _0 , CWD, bilateral pyelectasis with otherwise normal anatomy, cephalic presentation, [id number] 10% EFW   Prenatal History/Complications:  High risk pregnancy Bilateral pyelectasis on [place]Mild polyhydramnios in third trimester, MFM recommended IOL at 39w FGR, resolved Uterine size date discrepancy Anemia in pregnancy MOB and FOB alpha thalassemia silent carriers  Past Medical History: Past Medical History:  Diagnosis Date  . Anemia     Past Surgical History: Past Surgical History:  Procedure Laterality Date  . NO PAST SURGERIES      Obstetrical History: OB History    Gravida  1   Para      Term      Preterm      AB      Living  0     SAB      TAB      Ectopic      Multiple      Live Births              Social History Social History   Socioeconomic History  . Marital status: Single    Spouse name: Not on file  . Number of children: Not on file  . Years of education: Not on file  . Highest education level: Not on file  Occupational History  . Not on file  Tobacco Use  . Smoking status: Never Smoker  . Smokeless tobacco: Never Used  Vaping Use  . Vaping Use: Never used  Substance and Sexual Activity  . Alcohol use: Never  . Drug use: Never  . Sexual activity: Yes  Other Topics Concern  . Not on file  Social History Narrative  . Not on file   Social Determinants of Health   Financial Resource Strain:   . Difficulty of Paying Living Expenses: Not on file  Food Insecurity:   . Worried About RCharity fundraiserin the  Last Year: Not on file  . Ran Out of Food in the Last Year: Not on file  Transportation Needs:   . Lack of Transportation (Medical): Not on file  . Lack of Transportation (Non-Medical): Not on file  Physical Activity:   . Days of Exercise per Week: Not on file  . Minutes of Exercise per Session: Not on file  Stress:   . Feeling of Stress : Not on file  Social Connections:   . Frequency of Communication with Friends and Family: Not on file  . Frequency of Social Gatherings with Friends and Family: Not on file  . Attends Religious Services: Not on file  . Active Member of Clubs or Organizations: Not on file  . Attends CArchivistMeetings: Not on file  . Marital Status: Not on file    Family History: Family History  Problem Relation Age of Onset  . Healthy Mother   . Healthy Father   . Hypertension Paternal Grandmother   . Hypertension Paternal Grandfather   . Lupus Paternal Grandfather   . Sickle cell trait Paternal Grandfather   . Diabetes Maternal Grandfather     Allergies: No Known  Allergies  Medications Prior to Admission  Medication Sig Dispense Refill Last Dose  . Blood Pressure Monitoring (BLOOD PRESSURE KIT) DEVI 1 Device by Does not apply route as needed. 1 each 0   . Doxylamine-Pyridoxine (DICLEGIS) 10-10 MG TBEC Take 2 tablets by mouth at bedtime. If symptoms persist, add one tablet in the morning and one in the afternoon (Patient not taking: Reported on 06/01/2019) 100 tablet 5   . Elastic Bandages & Supports (COMFORT FIT MATERNITY SUPP SM) MISC Wear as directed. (Patient not taking: Reported on 09/22/2019) 1 each 0   . ferrous sulfate 325 (65 FE) MG tablet Take 1 tablet (325 mg total) by mouth daily. (Patient not taking: Reported on 08/17/2019) 60 tablet 2   . Prenatal MV-Min-FA-Omega-3 (PRENATAL GUMMIES/DHA & FA) 0.4-32.5 MG CHEW Chew 3 tablets by mouth daily. 90 tablet 6   . promethazine (PHENERGAN) 25 MG tablet Take 1 tablet (25 mg total) by mouth every 6  (six) hours as needed for nausea. (Patient not taking: Reported on 06/01/2019) 30 tablet 0      Review of Systems   All systems reviewed and negative except as stated in HPI  Blood pressure 118/79, pulse 89, temperature (!) 97.3 F (36.3 C), temperature source Oral, height _0  (1.549 m), weight 69.4 kg, last menstrual period 01/20/2019. General appearance: alert, cooperative, appears stated age and no distress Lungs: clear to auscultation bilaterally Heart: regular rate and rhythm Abdomen: soft, non-tender; bowel sounds normal Pelvic: closed, thick, and high Extremities: Homans sign is negative, no sign of DVT Presentation: cephalic per last US Fetal monitoring Baseline: 145-150 bpm, Variability: Good {> 6 bpm), Accelerations: Reactive and Decelerations: Abesent Uterine activityFrequency: Infrequent Dilation: Closed Effacement (%): Thick Station: -2 Exam by:: J.Cox, RN   Prenatal labs: ABO, Rh: --/--/A POS (10/07 1030) Antibody: NEG (10/07 1030) Rubella: 3.50 (04/01 0953) RPR: NON REACTIVE (10/07 1027)  HBsAg: Negative (04/01 0953)  HIV: Non Reactive (07/22 1638)  GBS: Negative/-- (09/17 1142)  1 hr Glucola: passed Genetic screening:  both parents silent carriers for alpha thalassemia (HbA2), 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) Anatomy US: bilateral pyelectasis demonstrated on 9/17 [place], infant kidney evaluation recommended after birth  Prenatal Transfer Tool  Maternal Diabetes: No Genetic Screening: Abnormal:  Results: Other: both parents silent carriers for alpha thalassemia (HbA2), 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) Maternal Ultrasounds/Referrals: Fetal renal pyelectasis Fetal Ultrasounds or other Referrals:  Referred to Materal Fetal Medicine  Maternal Substance Abuse:  No Significant Maternal Medications:  None Significant Maternal Lab Results: None  Results for orders placed or performed during the hospital encounter of  10/12/19 (from the past 24 hour(s))  CBC   Collection Time: 10/12/19 10:27 AM  Result Value Ref Range   WBC 6.7 4.0 - 10.5 K/uL   RBC 4.98 3.87 - 5.11 MIL/uL   Hemoglobin 12.2 12.0 - 15.0 g/dL   HCT 39.4 36 - 46 %   MCV 79.1 (L) 80.0 - 100.0 fL   MCH 24.5 (L) 26.0 - 34.0 pg   MCHC 31.0 30.0 - 36.0 g/dL   RDW 22.6 (H) 11.5 - 15.5 %   Platelets 232 150 - 400 K/uL   nRBC 0.0 0.0 - 0.2 %  RPR   Collection Time: 10/12/19 10:27 AM  Result Value Ref Range   RPR Ser Ql NON REACTIVE NON REACTIVE  Type and screen   Collection Time: 10/12/19 10:30 AM  Result Value Ref Range   ABO/RH(D) A POS  Antibody Screen NEG    Sample Expiration      10/15/2019,2359 Performed at [facility], [place] [address], [place], [place] [address]     Patient Active Problem List   Diagnosis Date Noted  . Polyhydramnios 10/12/2019  . Polyhydramnios affecting pregnancy in third trimester 10/10/2019  . Small for gestational age (SGA) 10/10/2019  . Anemia in pregnancy 08/01/2019  . Uterine size date discrepancy pregnancy 07/27/2019  . Alpha thalassemia silent carrier 04/18/2019  . Encounter for supervision of normal first pregnancy in first trimester 03/29/2019    Assessment/Plan:  Amabel Stmarie is a 19 y.o. G1P0 at 34w0dhere for IOL for mild polyhydramnios.   #Labor: Currently cervix is cl/th/high, will start induction with cytotec PO.  #Pain: Desires epidural #FWB: Cat 1 #ID:  No abx indicated at this time #MOF: breast #MOC:depo #Circ:  N/a, female infant  CEzequiel Essex MD  10/12/2019, 1:22 PM   Attestation of Attending Supervision of Resident: Evaluation and management procedures were performed by the [facility] under my supervision. I was immediately available for direct supervision, assistance and direction throughout this encounter.  I also confirm that I have verified the information documented in the resident's note, and that I have also personally reperformed the  pertinent components of the physical exam and all of the medical decision making activities.   Anemia resolved on admission, with Hb 12.2 / Hct 39.4, likely prenatal anemia 2/2 iron deficiency.   EKatherine Basset DHixtonfor [employer] [facility] 10/12/2019  5:30 PM

## 2019-10-12 NOTE — Progress Notes (Addendum)
Labor Progress Note Cassandra Bradley is a 19 y.o. G1P0 at [redacted]w[redacted]d presented for IOL for polyhydramnios.  S: Cassandra Bradley is doing well. Reports no pain or pressure, has stopped feeling contractions. In no distress, using computer in bed to do school work.   O:  BP 120/72   Pulse 88   Temp (!) 97.3 F (36.3 C) (Oral)   Ht 5\' 1"  (1.549 m)   Wt 69.4 kg   LMP 01/20/2019 (Approximate)   BMI 28.93 kg/m  EFM: baseline 150 bpm / moderate variability / 15x15 accels present, no decels  CVE: Dilation: Closed Effacement (%): Thick Station: -2 Presentation: Vertex Exam by:: J.Cox, RN  Cook catheter placed with ease, with intrauterine balloon inflated to 60cc of saline, and intravaginal balloon inflated to 40cc of saline. Patient tolerated procedure well.   A&P: 19 y.o. G1P0 [redacted]w[redacted]d for IOL for polyhydramnios.  #Labor: Progressing. FB (cooks) placed at 16:40. Cytotec x2, last at 16:40.  #Pain: Desires epidural later.  #FWB: Cat 1 #GBS negative  #Anemia in pregnancy: Hgb today 12.2.     [redacted]w[redacted]d, MD 3:41 PM  Attestation of Attending Supervision of Resident: Evaluation and management procedures were performed by the [facility] Resident under my supervision. I was immediately available for direct supervision, assistance and direction throughout this encounter.  I also confirm that I have verified the information documented in the resident's note, and that I have also personally reperformed the pertinent components of the physical exam and all of the medical decision making activities.   I personally placed Cook's catheter balloon.  [facility], DO [facility] for [facility] 10/12/2019  5:45 PM

## 2019-10-12 NOTE — Progress Notes (Signed)
Vitals:   10/12/19 1846 10/12/19 2045  BP: 119/77 116/69  Pulse: (!) 111 87  Resp:  16  Temp:  98 F (36.7 C)   Pt uncomfortable, cramping.  H20 removed from vaginal balloon, uterine balloon inflated w/20cc more (total 80cc).  FHR Cat 1. Ctx mild, irregular. Continue present mgt.

## 2019-10-13 ENCOUNTER — Inpatient Hospital Stay (HOSPITAL_COMMUNITY): Admitting: Anesthesiology

## 2019-10-13 MED ORDER — TERBUTALINE SULFATE 1 MG/ML IJ SOLN: 0.2500 mg | Freq: Once | INTRAMUSCULAR | Status: DC | PRN

## 2019-10-13 MED ORDER — OXYTOCIN-SODIUM CHLORIDE 30-0.9 UT/500ML-% IV SOLN
1.0000 m[IU]/min | INTRAVENOUS | Status: DC
  Administered 2019-10-13: 2 m[IU]/min via INTRAVENOUS

## 2019-10-13 MED ORDER — LIDOCAINE HCL (PF) 1 % IJ SOLN
INTRAMUSCULAR | Status: DC | PRN
  Administered 2019-10-13: 8 mL via EPIDURAL
  Administered 2019-10-13: 4 mL via EPIDURAL

## 2019-10-13 MED ORDER — FENTANYL CITRATE (PF) 2500 MCG/50ML IJ SOLN
INTRAMUSCULAR | Status: DC | PRN
Start: 2019-10-13 — End: 2019-10-14
  Administered 2019-10-13: 12 mL/h via EPIDURAL

## 2019-10-13 NOTE — Progress Notes (Addendum)
Labor Progress Note  Subjective:  Feeling contractions, but not uncomfortable enough yet for epidural. Up and walking around the room.   Objective:  BP 114/73   Pulse 80   Temp 98.6 F (37 C) (Oral)   Resp 18   Ht 5\' 1"  (1.549 m)   Wt 69.4 kg   LMP 01/20/2019 (Approximate)   BMI 28.93 kg/m  Gen: Walking around room . NAD.  Extremities: No signs of DVT.   CE: Dilation: 2.5 Effacement (%): 50 Cervical Position: Posterior Station: -2 Presentation: Vertex Exam by:: K Koostra CNM Contractions: regular q1-64minutes FH: BL 140, minimal  var, - a, none decels.   Assessment and Plan:  Cassandra Bradley is a 19 y.o. G1P0 at [redacted]w[redacted]d admitted for Polyhydramnios [O40.9XX0].  Patient Active Problem List   Diagnosis Date Noted   Polyhydramnios 10/12/2019   Polyhydramnios affecting pregnancy in third trimester 10/10/2019   Small for gestational age (SGA) 10/10/2019   Anemia in pregnancy 08/01/2019   Uterine size date discrepancy pregnancy 07/27/2019   Alpha thalassemia silent carrier 04/18/2019   Encounter for supervision of normal first pregnancy in first trimester 03/29/2019   Labor:  03/31/2019  In 10/7@1640 , removed 10/8 @ 1520.  Cytotec 10/7 @1126 , 10/8 @ 0200, 1115) Pitocin 1520 @ 59mu Pain control: Epidural Anticipated MOD: NSVD  Fetal Wellbeing: Category I  GBS Negative/-- (09/17 1142)  Continuous fetal monitoring  3m, M.D.  FM PGY 3 10/13/2019 7:19 PM   Attestation of Supervision of Student:  I confirm that I have verified the information documented in the  resident  student's note and that I have also personally reperformed the history, physical exam and all medical decision making activities.  I have verified that all services and findings are accurately documented in this student's note; and I agree with management and plan as outlined in the documentation. I have also made any necessary editorial changes.    Genia Hotter, CNM Center for 12/13/2019, [facility] 10/15/2019 2:07 PM

## 2019-10-13 NOTE — Progress Notes (Signed)
   Cassandra Bradley is a 19 y.o. G1P0 at [redacted]w[redacted]d  admitted for induction of labor due to mild polyhydramnios.   Subjective:  Doing well pain is a 4 or a 5/10 Objective: Vitals:   10/13/19 0210 10/13/19 0305 10/13/19 0535 10/13/19 0908  BP: 112/68 108/69 (!) 96/58 109/66  Pulse: 86 (!) 107 82 95  Resp: 16 16 16 18   Temp: 98.6 F (37 C)  98 F (36.7 C) 98.2 F (36.8 C)  TempSrc: Oral  Oral Oral  Weight:      Height:       No intake/output data recorded.  FHT:  FHR: 135 bpm, variability: moderate,  accelerations:  Present,  decelerations:  Absent UC:   2-3 in 10 min SVE:   Dilation: 2 Effacement (%): 50 Station: -2 Exam by:: [email or web address], CNM   Labs: Lab Results  Component Value Date   WBC 6.7 10/12/2019   HGB 12.2 10/12/2019   HCT 39.4 10/12/2019   MCV 79.1 (L) 10/12/2019   PLT 232 10/12/2019    Assessment / Plan: early labor; patient will bounce on birth ball and then shower. understands that ctx need to be closer together and stronger. Tugged on Cook catheter and no movement; unable to assess dilation around [place] but assume is still 2-3. Will give anotehr dose of cytotec.  Will monitor FH for one hour, if Cat 1 tracing patient can will take a shower.  Labor: early labor Fetal Wellbeing:  Category I Pain Control:  Labor support without medications Anticipated MOD:  NSVD  Cassandra Bradley 10/13/2019, 11:14 AM

## 2019-10-13 NOTE — Progress Notes (Signed)
   Cassandra Bradley is a 19 y.o. G1P0 at [redacted]w[redacted]d  admitted for induction of labor due to mild polyhydramnios.  Subjective: Patient resting in bed, watching TV. Reports pain is a 5/10.   Objective: Vitals:   10/13/19 0210 10/13/19 0305 10/13/19 0535 10/13/19 0908  BP: 112/68 108/69 (!) 96/58 109/66  Pulse: 86 (!) 107 82 95  Resp: 16 16 16 18   Temp: 98.6 F (37 C)  98 F (36.7 C) 98.2 F (36.8 C)  TempSrc: Oral  Oral Oral  Weight:      Height:       No intake/output data recorded.  FHT:  FHR: 135 bpm, variability: moderate,  accelerations:  Present,  decelerations:  Absent UC:   irregular, every 2-3 minutes SVE:   Dilation: 2 Effacement (%): 50 Station: -2 Exam by:: [email or web address], RN   Labs: Lab Results  Component Value Date   WBC 6.7 10/12/2019   HGB 12.2 10/12/2019   HCT 39.4 10/12/2019   MCV 79.1 (L) 10/12/2019   PLT 232 10/12/2019    Assessment / Plan: early labor, FB Is in place. after discussion with RN, will wait 30 minutes and assess contraction pattern to see if cytotec safe to give.   Labor: early labor Fetal Wellbeing:  Category I Pain Control:  Labor support without medications Anticipated MOD:  NSVD  12/12/2019 10/13/2019, 9:23 AM

## 2019-10-13 NOTE — Anesthesia Procedure Notes (Signed)
Epidural Patient location during procedure: OB Start time: 10/13/2019 8:00 PM End time: 10/13/2019 8:02 PM  Staffing Anesthesiologist: Leilani Able, MD Performed: anesthesiologist   Preanesthetic Checklist Completed: patient identified, IV checked, site marked, risks and benefits discussed, surgical consent, monitors and equipment checked, pre-op evaluation and timeout performed  Epidural Patient position: sitting Prep: DuraPrep and site prepped and draped Patient monitoring: continuous pulse ox and blood pressure Approach: midline Location: L3-L4 Injection technique: LOR air  Needle:  Needle type: Tuohy  Needle gauge: 17 G Needle length: 9 cm and 9 Needle insertion depth: 5 cm cm Catheter type: closed end flexible Catheter size: 19 Gauge Catheter at skin depth: 10 cm Test dose: negative and Other  Assessment Events: blood not aspirated, injection not painful, no injection resistance, no paresthesia and negative IV test  Additional Notes Reason for block:procedure for pain

## 2019-10-13 NOTE — Anesthesia Preprocedure Evaluation (Signed)
Anesthesia Evaluation  Patient identified by MRN, date of birth, ID band Patient awake    Reviewed: Allergy & Precautions, H&P , NPO status , Patient's Chart, lab work & pertinent test results  Airway Mallampati: I  TM Distance: >3 FB Neck ROM: full    Dental no notable dental hx.    Pulmonary neg pulmonary ROS,    Pulmonary exam normal        Cardiovascular negative cardio ROS Normal cardiovascular exam     Neuro/Psych negative neurological ROS  negative psych ROS   GI/Hepatic negative GI ROS, Neg liver ROS,   Endo/Other  negative endocrine ROS  Renal/GU negative Renal ROS  negative genitourinary   Musculoskeletal negative musculoskeletal ROS (+)   Abdominal Normal abdominal exam  (+)   Peds  Hematology   Anesthesia Other Findings   Reproductive/Obstetrics (+) Pregnancy                             Anesthesia Physical Anesthesia Plan  ASA: II  Anesthesia Plan: Epidural   Post-op Pain Management:    Induction:   PONV Risk Score and Plan:   Airway Management Planned:   Additional Equipment: None  Intra-op Plan:   Post-operative Plan:   Informed Consent: I have reviewed the patients History and Physical, chart, labs and discussed the procedure including the risks, benefits and alternatives for the proposed anesthesia with the patient or authorized representative who has indicated his/her understanding and acceptance.       Plan Discussed with:   Anesthesia Plan Comments:         Anesthesia Quick Evaluation

## 2019-10-13 NOTE — Progress Notes (Signed)
Labor Progress Note Cassandra Bradley is a 19 y.o. G1P0 at [redacted]w[redacted]d presented for IOL-poly (AFI 24.6).  S: Doing well without complaints.   O:  BP 117/80   Pulse 89   Temp 98.6 F (37 C) (Oral)   Resp 18   Ht 5\' 1"  (1.549 m)   Wt 69.4 kg   LMP 01/20/2019 (Approximate)   SpO2 100%   BMI 28.93 kg/m  EFM: baseline 140bpm/mod variability/+ accels/no decels Toco: q1-5 minutes  CVE: Dilation: 3 Effacement (%): 90 Cervical Position: Posterior Station: -2 Presentation: Vertex Exam by:: [email or web address] RNC    A&P: 19 y.o. G1P0 [redacted]w[redacted]d presented for IOL-poly (AFI 24.6) #IOL: S/p cook catheter (removed), cytotec x3. Started at pitocin 10/8 @1520 . Continue to titrate. #Pain: CSE #FWB: cat 1 #GBS negative  12/8, MD 9:53 PM

## 2019-10-13 NOTE — Progress Notes (Signed)
LABOR PROGRESS NOTE  Cassandra Bradley is a 19 y.o. G1P0 at [redacted]w[redacted]d admitted for mild poly (AFI 24.6 @38 +0)  Subjective: Patient resting in bed. Patient reports regular, painful contractions.  Objective: BP (!) 96/58   Pulse 82   Temp 98 F (36.7 C) (Oral)   Resp 16   Ht 5\' 1"  (1.549 m)   Wt 69.4 kg   LMP 01/20/2019 (Approximate)   BMI 28.93 kg/m   Dilation: 2 Effacement (%): 50 Station: -2 Presentation: Vertex Exam by:: 01/22/2019, RN Fetal monitoring: Baseline: 130 bpm, Variability: Good {> 6 bpm), Accelerations: Reactive, and Decelerations: Absent Uterine activity: q3-6 mins  Labs: Lab Results  Component Value Date   WBC 6.7 10/12/2019   HGB 12.2 10/12/2019   HCT 39.4 10/12/2019   MCV 79.1 (L) 10/12/2019   PLT 232 10/12/2019    Patient Active Problem List   Diagnosis Date Noted  . Polyhydramnios 10/12/2019  . Polyhydramnios affecting pregnancy in third trimester 10/10/2019  . Small for gestational age (SGA) 10/10/2019  . Anemia in pregnancy 08/01/2019  . Uterine size date discrepancy pregnancy 07/27/2019  . Alpha thalassemia silent carrier 04/18/2019  . Encounter for supervision of normal first pregnancy in first trimester 03/29/2019    Assessment / Plan: IOL for mild poly  #Labor: Cytotec started @ 1126 (last dose 0200). S/p FB 1640. Plan to start pit when FB out. #Fetal Wellbeing:  Category I #Pain Control: per request, desires epidural #ID: [id number] #Anticipated MOD: VD  #MOF: breast  04/20/2019, PGY-1 Family Medicine Resident, [facility] Faculty Teaching Service  10/13/2019, 5:48 AM

## 2019-10-13 NOTE — Progress Notes (Signed)
   Cassandra Bradley is a 19 y.o. G1P0 at [redacted]w[redacted]d  admitted for induction of labor due to Mild polyhydramnios.   Subjective: Coping well, has been ambulating and took a shower.   Objective: Vitals:   10/13/19 0535 10/13/19 0908 10/13/19 1212 10/13/19 1525  BP: (!) 96/58 109/66 122/81 114/73  Pulse: 82 95 (!) 113 80  Resp: 16 18 18 18   Temp: 98 F (36.7 C) 98.2 F (36.8 C)  98.6 F (37 C)  TempSrc: Oral Oral  Oral  Weight:      Height:       No intake/output data recorded.  FHT:  FHR: 135 bpm, variability: moderate,  accelerations:  Present,  decelerations:  Absent UC:   Irregular, q 1-3 SVE:   Dilation: 2.5 Effacement (%): 50 Station: -2 Exam by:: K Koostra CNM   Labs: Lab Results  Component Value Date   WBC 6.7 10/12/2019   HGB 12.2 10/12/2019   HCT 39.4 10/12/2019   MCV 79.1 (L) 10/12/2019   PLT 232 10/12/2019    Assessment / Plan: early labor.   Cooks has been in for 23 hours, removed. Patient's cervix is 2.5/50/-2 now with bloody show. Will start pitocin; patient may have epidural upon request.   Labor: Progressing normally Fetal Wellbeing:  Category I Pain Control:  Labor support without medications Anticipated MOD:  NSVD  04-03-2000 10/13/2019, 3:26 PM

## 2019-10-14 ENCOUNTER — Encounter (HOSPITAL_COMMUNITY): Payer: Self-pay | Admitting: Obstetrics & Gynecology

## 2019-10-14 DIAGNOSIS — Z3A39 39 weeks gestation of pregnancy: Secondary | ICD-10-CM

## 2019-10-14 MED ORDER — IBUPROFEN 600 MG PO TABS
600.0000 mg | ORAL_TABLET | Freq: Four times a day (QID) | ORAL | Status: DC
  Administered 2019-10-14 – 2019-10-16 (×9): 600 mg via ORAL
  Filled 2019-10-14 (×9): qty 1

## 2019-10-14 MED ORDER — PRENATAL MULTIVITAMIN CH
1.0000 | ORAL_TABLET | Freq: Every day | ORAL | Status: DC
  Administered 2019-10-14 – 2019-10-16 (×3): 1 via ORAL
  Filled 2019-10-14 (×3): qty 1

## 2019-10-14 MED ORDER — TETANUS-DIPHTH-ACELL PERTUSSIS 5-2.5-18.5 LF-MCG/0.5 IM SUSP: 0.5000 mL | Freq: Once | INTRAMUSCULAR | Status: DC

## 2019-10-14 MED ORDER — SIMETHICONE 80 MG PO CHEW: 80.0000 mg | CHEWABLE_TABLET | ORAL | Status: DC | PRN

## 2019-10-14 MED ORDER — MEDROXYPROGESTERONE ACETATE 150 MG/ML IM SUSP: 150.0000 mg | INTRAMUSCULAR | Status: DC | PRN

## 2019-10-14 MED ORDER — TRANEXAMIC ACID-NACL 1000-0.7 MG/100ML-% IV SOLN
INTRAVENOUS | Status: AC
  Filled 2019-10-14: qty 100

## 2019-10-14 MED ORDER — ACETAMINOPHEN 325 MG PO TABS: 650.0000 mg | ORAL_TABLET | ORAL | Status: DC | PRN

## 2019-10-14 MED ORDER — ONDANSETRON HCL 4 MG/2ML IJ SOLN: 4.0000 mg | INTRAMUSCULAR | Status: DC | PRN

## 2019-10-14 MED ORDER — WITCH HAZEL-GLYCERIN EX PADS: 1.0000 "application " | MEDICATED_PAD | CUTANEOUS | Status: DC | PRN

## 2019-10-14 MED ORDER — SENNOSIDES-DOCUSATE SODIUM 8.6-50 MG PO TABS
2.0000 | ORAL_TABLET | ORAL | Status: DC
  Administered 2019-10-14 – 2019-10-16 (×2): 2 via ORAL
  Filled 2019-10-14 (×2): qty 2

## 2019-10-14 MED ORDER — BENZOCAINE-MENTHOL 20-0.5 % EX AERO
1.0000 "application " | INHALATION_SPRAY | CUTANEOUS | Status: DC | PRN
  Administered 2019-10-16: 1 via TOPICAL
  Filled 2019-10-14: qty 56

## 2019-10-14 MED ORDER — TRANEXAMIC ACID-NACL 1000-0.7 MG/100ML-% IV SOLN
1000.0000 mg | Freq: Once | INTRAVENOUS | Status: AC
  Administered 2019-10-14: 1000 mg via INTRAVENOUS

## 2019-10-14 MED ORDER — DIBUCAINE (PERIANAL) 1 % EX OINT: 1.0000 "application " | TOPICAL_OINTMENT | CUTANEOUS | Status: DC | PRN

## 2019-10-14 MED ORDER — COCONUT OIL OIL: 1.0000 "application " | TOPICAL_OIL | Status: DC | PRN

## 2019-10-14 MED ORDER — DIPHENHYDRAMINE HCL 25 MG PO CAPS: 25.0000 mg | ORAL_CAPSULE | Freq: Four times a day (QID) | ORAL | Status: DC | PRN

## 2019-10-14 MED ORDER — ONDANSETRON HCL 4 MG PO TABS: 4.0000 mg | ORAL_TABLET | ORAL | Status: DC | PRN

## 2019-10-14 NOTE — Progress Notes (Signed)
Labor Progress Note Cassandra Bradley is a 19 y.o. G1P0 at [redacted]w[redacted]d presented for IOL-poly (AFI 24.6).  S: Doing well without complaints.   O:  BP (!) 106/44   Pulse 78   Temp 98.6 F (37 C) (Oral)   Resp 18   Ht 5\' 1"  (1.549 m)   Wt 69.4 kg   LMP 01/20/2019 (Approximate)   SpO2 100%   BMI 28.93 kg/m  EFM: baseline 150bpm/mod variability/+ accels/intermittent late and variable decels with contractions Toco: q1-5 minutes  CVE: Dilation: 6 Effacement (%): 90 Cervical Position: Posterior Station: 0 Presentation: Vertex Exam by:: [email or web address] RNC    A&P: 19 y.o. G1P0 [redacted]w[redacted]d presented for IOL-poly (AFI 24.6) #IOL: S/p cook catheter (removed), cytotec x3. Started at pitocin 10/8 @1520 . Given FHT, pitocin stopped at this time. Will continue to monitor and restart as able. Consider AROM at next cervical exam. #Pain: CSE #FWB: cat 2, but overall reassuring #GBS negative  12/8, MD 12:34 AM

## 2019-10-14 NOTE — Discharge Instructions (Signed)

## 2019-10-14 NOTE — Discharge Summary (Signed)
   Postpartum Discharge Summary  Date of Service updated 10/16/19     Patient Name: Cassandra Bradley DOB: 12/11/2000 MRN: [id number]  Date of admission: 10/12/2019 Delivery date:10/14/2019  Delivering provider: FIRESTONE, ALICIA C  Date of discharge: 10/16/2019  Admitting diagnosis: Polyhydramnios [O40.9XX0] Intrauterine pregnancy: [redacted]w[redacted]d     Secondary diagnosis:  Active Problems:   Encounter for supervision of normal first pregnancy in first trimester   Alpha thalassemia silent carrier   Anemia in pregnancy   Polyhydramnios affecting pregnancy in third trimester   Small for gestational age (SGA)   Polyhydramnios   Vaginal delivery   First degree perineal laceration  Additional problems: none    Discharge diagnosis: Term Pregnancy Delivered                                              Post partum procedures:Depo Provera 10/16/19 Augmentation: Pitocin, Cytotec and IP Foley Complications: None  Hospital course: Induction of Labor With Vaginal Delivery   19 y.o. yo G1P0 at [redacted]w[redacted]d was admitted to the hospital 10/12/2019 for induction of labor.  Indication for induction: borderline polyhydramnios.  Patient had an uncomplicated labor course as follows: Membrane Rupture Time/Date: 2:04 AM ,10/14/2019   Delivery Method:Vaginal, Spontaneous  Episiotomy: None  Lacerations:  1st degree  Details of delivery can be found in separate delivery note.  Patient had a routine postpartum course. Patient is discharged home 10/16/19.  Newborn Data: Birth date:10/14/2019  Birth time:3:20 AM  Gender:Female  Living status:Living  Apgars:8 ,9  Weight:2750 g   Magnesium Sulfate received: No BMZ received: No Rhophylac:N/A MMR:N/A T-DaP:Given prenatally Flu: Yes Transfusion:No  Physical exam  Vitals:   10/15/19 0527 10/15/19 1548 10/16/19 0015 10/16/19 0543  BP: 107/65 114/83 105/69 120/67  Pulse: 74 65 66 82  Resp: 18 18  18  Temp: 98.5 F (36.9 C) 98.9 F (37.2 C)  98.5 F (36.9 C)   TempSrc: Oral Oral  Oral  SpO2: 100%  100% 100%  Weight:      Height:       General: alert Lochia: appropriate Uterine Fundus: firm Incision: Healing well with no significant drainage DVT Evaluation: No evidence of DVT seen on physical exam. Labs: Lab Results  Component Value Date   WBC 6.7 10/12/2019   HGB 12.2 10/12/2019   HCT 39.4 10/12/2019   MCV 79.1 (L) 10/12/2019   PLT 232 10/12/2019   CMP Latest Ref Rng & Units 02/26/2019  Glucose 70 - 99 mg/dL 86  BUN 6 - 20 mg/dL 12  Creatinine 0.44 - 1.00 mg/dL 0.75  Sodium 135 - 145 mmol/L 135  Potassium 3.5 - 5.1 mmol/L 3.8  Chloride 98 - 111 mmol/L 104  CO2 22 - 32 mmol/L 18(L)  Calcium 8.9 - 10.3 mg/dL 9.6  Total Protein 6.5 - 8.1 g/dL 8.1  Total Bilirubin 0.3 - 1.2 mg/dL 0.7  Alkaline Phos 38 - 126 U/L 50  AST 15 - 41 U/L 23  ALT 0 - 44 U/L 16   Edinburgh Score: Edinburgh Postnatal Depression Scale Screening Tool 10/15/2019  I have been able to laugh and see the funny side of things. 0  I have looked forward with enjoyment to things. 0  I have blamed myself unnecessarily when things went wrong. 2  I have been anxious or worried for no good reason. 2  I have felt scared or   panicky for no good reason. 1  Things have been getting on top of me. 2  I have been so unhappy that I have had difficulty sleeping. 0  I have felt sad or miserable. 1  I have been so unhappy that I have been crying. 0  The thought of harming myself has occurred to me. 0  Edinburgh Postnatal Depression Scale Total 8     After visit meds:  Allergies as of 10/16/2019   No Known Allergies     Medication List    STOP taking these medications   Comfort Fit Maternity Supp Sm Misc   Doxylamine-Pyridoxine 10-10 MG Tbec Commonly known as: Diclegis   ferrous sulfate 325 (65 FE) MG tablet   promethazine 25 MG tablet Commonly known as: PHENERGAN     TAKE these medications   Blood Pressure Kit Devi 1 Device by Does not apply route as  needed.   ibuprofen 600 MG tablet Commonly known as: ADVIL Take 1 tablet (600 mg total) by mouth every 6 (six) hours.   Prenatal Gummies/DHA & FA 0.4-32.5 MG Chew Chew 3 tablets by mouth daily.        Discharge home in stable condition Infant Feeding: No evidence of DVT seen on physical exam. Infant Disposition:home with mother Discharge instruction: per After Visit Summary and Postpartum booklet. Activity: Advance as tolerated. Pelvic rest for 6 weeks.  Diet: routine diet Future Appointments: Future Appointments  Date Time Provider [place]  11/13/2019  2:00 PM Leftwich-Kirby, Kathie Dike, CNM [facility] None   Follow up Visit:  Follow-up Information    [place]. Schedule an appointment as soon as possible for a visit in 4 week(s).   Why: 4 weeks PP visit Contact information: [address] [place] [address] [phone]               Please schedule this patient for a In person postpartum visit in 4 weeks with the following provider: Any provider. Additional Postpartum F/U:none  Low risk pregnancy complicated by: n/a Delivery mode:  Vaginal, Spontaneous  Anticipated Birth Control:  PP Depo given  10/16/19   10/16/2019 Chancy Milroy, MD

## 2019-10-14 NOTE — Lactation Note (Signed)
This note was copied from a baby's chart. Lactation Consultation Note  Patient Name: Cassandra Bradley WCBJS'E Date: 10/14/2019    Mom and baby sleeping upon visit. LC will come back to room at another time as possible.     Cassandra Bradley 10/14/2019, 10:22 PM

## 2019-10-14 NOTE — Anesthesia Postprocedure Evaluation (Signed)
Anesthesia Post Note  Patient: Cassandra Bradley  Procedure(s) Performed: AN AD HOC LABOR EPIDURAL     Patient location during evaluation: Mother Baby Anesthesia Type: Epidural Level of consciousness: awake and alert Pain management: pain level controlled Vital Signs Assessment: post-procedure vital signs reviewed and stable Respiratory status: spontaneous breathing Cardiovascular status: stable Postop Assessment: no headache, adequate PO intake, no backache, patient able to bend at knees, able to ambulate, epidural receding and no apparent nausea or vomiting Anesthetic complications: no   No complications documented.  Last Vitals:  Vitals:   10/14/19 0607 10/14/19 0651  BP: 106/68 102/70  Pulse: 72 89  Resp: 18 16  Temp: 36.8 C 36.9 C  SpO2: 100% 100%    Last Pain:  Vitals:   10/14/19 0651  TempSrc: Oral  PainSc: 0-No pain   Pain Goal:                   Salome Arnt

## 2019-10-15 NOTE — Progress Notes (Addendum)
Postpartum Day 1  Subjective Up ad lib, voiding, tolerating PO, and + flatus. Denies dizziness, lightheadedness, tachycardia. Appropriate Lochia. Pain well controlled.  Objective BP 107/65 (BP Location: Right Arm)    Pulse 74    Temp 98.5 F (36.9 C) (Oral)    Resp 18    Ht 5\' 1"  (1.549 m)    Wt 69.4 kg    LMP 01/20/2019 (Approximate)    SpO2 100%    Breastfeeding Unknown    BMI 28.93 kg/m  General: alert, cooperative, NAD Uterine Fundus: firm.  MSK: moving extremities spontaneously. Warm and well perfused. No signs of DVT.   Assessment & Plan Postpartum Day # 1  Plan for d/c tomorrow   Pain: scheduled ibuprofen. PRN tylenol & oxycodone  Breast feeding, continue daily lactation consult  Contraception: depo      LOS: 3 days   01/22/2019 10/15/2019, 10:37 AM   Midwife attestation I have seen and examined this patient and agree with above documentation in the resident's note.   Post Partum Day 1  Cassandra Bradley is a 19 y.o. G1P1001 s/p SVD.  Pt denies problems with ambulating, voiding or po intake. Pain is well controlled. Method of Feeding: breast  PE:  Gen: well appearing Heart: reg rate Lungs: normal WOB Fundus firm Ext: soft, no pain, no edema  Assessment: S/p SVD PPD #1  Plan for discharge: tomorrow  26, CNM 12:14 PM

## 2019-10-15 NOTE — Lactation Note (Signed)
This note was copied from a baby's chart. Lactation Consultation Note Baby 21 hrs old LC attempted to see mom earlier but she was sleeping.  Mom sitting up in bed BF in cross cradle position when this LC entered room. Mom denied painful latch. Mom has tubular breast w/semi flat nipples. Evert to short shaft w/stimulation. Shells given to wear in am. Strongly encouraged mom to wear them. Nipples/areola very compressible.  Mom had good body alignment.  Encouraged mom to bring baby to her instead of mom leaning to baby. Mom was only leaning a little. Taught mom how to use her hand to bring baby to her. Placed foiled baby blanket under mom's hand that was holding baby's head for support.  Hand expression w/colostrum easily expressed. Encouraged mom to hand express occasionally while BF especially if baby is getting sleepy.  Discussed positions, support, props, and safety for feeding. Assisted in football position. Mom loved it. Discussed how it is good for newborns and learning to latch and good bonding position.  Discussed w/mom about pumping. LC encouraged to pump, hand express afterwards then give back EBM to baby d/t it will loose wt. And will probably drop below 6 lbs. Milk storage reviewed. Gave mom foley cup explaining how to use. Encouraged to call for assistance if needed.  LPI information sheet given discussing dropping wt. Below 6 lbs and how much to supplement according to hours of age.  Newborn feeding habits, behavior, milk storage, breast massage, STS, I&O, pumping, breast filling, engorgement, supply and demand. Mom encouraged to feed baby 8-12 times/24 hours and with feeding cues. Mom encouraged to waken baby for feeds if hasn't cued in 3 hrs.  Mom shown how to use DEBP & how to disassemble, clean, & reassemble parts. Mom knows to pump q3h for 15-20 min.  Mom doesn't have a pump yet. She wanted to wait and see if she liked BF before she bought one. LC offered to send [facility]  referral. Mom thankful.  Encouraged mom to call for assistance or questions. LC gave Lactation brochure mentioned OP LC if needed.    Patient Name: Cassandra Bradley CWCBJ'S Date: 10/15/2019 Reason for consult: Initial assessment;Primapara;Term   Maternal Data Has patient been taught Hand Expression?: Yes Does the patient have breastfeeding experience prior to this delivery?: No  Feeding Feeding Type: Breast Fed  LATCH Score Latch: Repeated attempts needed to sustain latch, nipple held in mouth throughout feeding, stimulation needed to elicit sucking reflex.  Audible Swallowing: Spontaneous and intermittent  Type of Nipple: Flat (semi flat)  Comfort (Breast/Nipple): Soft / non-tender  Hold (Positioning): Assistance needed to correctly position infant at breast and maintain latch.  LATCH Score: 7  Interventions Interventions: Breast feeding basics reviewed;Shells;Breast compression;Assisted with latch;Skin to skin;Adjust position;Breast massage;Support pillows;Hand pump;DEBP;Position options;Hand express;Pre-pump if needed  Lactation Tools Discussed/Used Tools: Shells;Pump Shell Type: Inverted Breast pump type: Double-Electric Breast Pump;Manual WIC Program: Yes Pump Review: Setup, frequency, and cleaning;Milk Storage Initiated by:: Peri Jefferson RN IBCLC Date initiated:: 10/15/19   Consult Status Consult Status: Follow-up Date: 10/15/19 (in pm) Follow-up type: In-patient    Benny Henrie, Diamond Nickel 10/15/2019, 1:15 AM

## 2019-10-16 MED ORDER — IBUPROFEN 600 MG PO TABS: 600.0000 mg | ORAL_TABLET | Freq: Four times a day (QID) | ORAL | 0 refills | Status: AC

## 2019-10-16 NOTE — Lactation Note (Signed)
This note was copied from a baby's chart. Lactation Consultation Note  Patient Name: Cassandra Bradley [id number] Date: 10/16/2019 Reason for consult: Follow-up assessment;Primapara;1st time breastfeeding  Follow up with 56 hours old infant of a P1 mother. Mother reports breastfeeding is going well. Mother denies any pain or discomfort with latch. Mother is pumping and collecting ~30 mL of EBM. Reviewed milk storage. Talked about feeding patterns first days of life and encouraged mother to use EBM to supplement as needed.   Reviewed lactation services information as well as other resources available and encouraged to contact for support and questions.  Mother is ready to be discharged.   Maternal Data Does the patient have breastfeeding experience prior to this delivery?: No  Feeding Feeding Type: Breast Milk  Interventions Interventions: Breast feeding basics reviewed;DEBP;Expressed milk  Lactation Tools Discussed/Used Breast pump type: Double-Electric Breast Pump WIC Program: Yes   Consult Status Consult Status: Complete Date: 10/16/19 Follow-up type: Call as needed    Deondria Puryear A Higuera Ancidey 10/16/2019, 11:31 AM

## 2019-10-16 NOTE — Lactation Note (Addendum)
This note was copied from a baby's chart. Lactation Consultation Note Baby is 57 hrs old.  Asked mom how BF going. Mom stated BF going well. Mom stated baby is BF every hr. LC asked mom how often has she been pumping. Mom stated it random because the baby is BF all the time. Asked mom if she has been giving the baby anything after BF mom stated occasionally w/cup. Reviewed w/mom about the supplementing d/t baby is small. Baby wt. 5.8 lbs yesterday. LC worried about how much wt. Baby has lost today.  Mom's breast are filling. LC easily expressed 10 ml colostrum. discussed baby should 20-30 ml of BM after BF every 3 hrs. Tech into weight baby. Baby's wt. Was 5.13 lbs. LC very surprised and very happy. Praised mom. Mom happy as well. Strongly encouraged mom to pump, 1. Because her milk is coming in and she needs to empty her breast, 2. Baby needs to be supplemented that great BM.  Mom doesn't want to give any bottles. Encouraged to use the foley cup that LC gave her.  Mom wearing bra. Praised her for that. Baby latched well. LC did encouraged mom to bring baby closer to her instead of leaning over, baby needs cheeks to breast.  LC gave mom bottle to store BM in to keep in ref. LC sent [facility] referral on 1st visit. Encouraged mom to call WIC this morning to see about getting DEBP.  Mom needs to be seen again before d/c home.  Patient Name: Girl Aubrianna Orchard [id number] Date: 10/16/2019 Reason for consult: Follow-up assessment;Primapara;Term;Infant < 6lbs   Maternal Data    Feeding Feeding Type: Breast Fed  LATCH Score Latch: Grasps breast easily, tongue down, lips flanged, rhythmical sucking.  Audible Swallowing: Spontaneous and intermittent  Type of Nipple: Everted at rest and after stimulation  Comfort (Breast/Nipple): Filling, red/small blisters or bruises, mild/mod discomfort (a little sore)  Hold (Positioning): No assistance needed to correctly position infant at breast.  LATCH  Score: 9  Interventions Interventions: Breast massage;Hand express;Expressed milk;Support pillows;Breast compression  Lactation Tools Discussed/Used     Consult Status Consult Status: Follow-up Date: 10/16/19 Follow-up type: In-patient    Charyl Dancer 10/16/2019, 5:53 AM

## 2019-10-19 ENCOUNTER — Inpatient Hospital Stay (HOSPITAL_COMMUNITY): Admit: 2019-10-19 | Payer: Self-pay

## 2019-11-13 ENCOUNTER — Ambulatory Visit (INDEPENDENT_AMBULATORY_CARE_PROVIDER_SITE_OTHER): Admitting: Advanced Practice Midwife

## 2019-11-13 ENCOUNTER — Encounter: Payer: Self-pay | Admitting: Advanced Practice Midwife

## 2019-11-13 ENCOUNTER — Other Ambulatory Visit: Payer: Self-pay

## 2019-11-13 VITALS — BP 119/76 | HR 95 | Wt 127.0 lb

## 2019-11-13 DIAGNOSIS — O9963 Diseases of the digestive system complicating the puerperium: Secondary | ICD-10-CM | POA: Diagnosis not present

## 2019-11-13 DIAGNOSIS — Z30013 Encounter for initial prescription of injectable contraceptive: Secondary | ICD-10-CM

## 2019-11-13 DIAGNOSIS — Z309 Encounter for contraceptive management, unspecified: Secondary | ICD-10-CM

## 2019-11-13 DIAGNOSIS — Z3202 Encounter for pregnancy test, result negative: Secondary | ICD-10-CM

## 2019-11-13 DIAGNOSIS — K5900 Constipation, unspecified: Secondary | ICD-10-CM | POA: Diagnosis not present

## 2019-11-13 DIAGNOSIS — Z3009 Encounter for other general counseling and advice on contraception: Secondary | ICD-10-CM

## 2019-11-13 LAB — POCT URINE PREGNANCY: Preg Test, Ur: NEGATIVE

## 2019-11-13 MED ORDER — MEDROXYPROGESTERONE ACETATE 150 MG/ML IM SUSP: 150.0000 mg | INTRAMUSCULAR | 4 refills | Status: DC

## 2019-11-13 MED ORDER — MEDROXYPROGESTERONE ACETATE 150 MG/ML IM SUSP
150.0000 mg | Freq: Once | INTRAMUSCULAR | Status: AC
  Administered 2019-11-13: 150 mg via INTRAMUSCULAR

## 2019-11-13 NOTE — Progress Notes (Signed)
Post Partum Visit Note  Yajahira Tison is a 19 y.o. G61P1001 female who presents for a postpartum visit. She is 4 weeks postpartum following a normal spontaneous vaginal delivery.  I have fully reviewed the prenatal and intrapartum course. The delivery was at 39 gestational weeks.  Anesthesia: epidural. Postpartum course has been Unremarkable Baby is doing well. Baby is feeding by breast. Bleeding staining only. Bowel function is Constipated . Bladder function is normal. Patient is not sexually active. Contraception method is none. Postpartum depression screening: negative.   The pregnancy intention screening data noted above was reviewed. Potential methods of contraception were discussed. The patient elected to proceed with Hormonal Injection.    Edinburgh Postnatal Depression Scale - 11/13/19 1408      Edinburgh Postnatal Depression Scale:  In the Past 7 Days   I have been able to laugh and see the funny side of things. 0    I have looked forward with enjoyment to things. 0    I have blamed myself unnecessarily when things went wrong. 1    I have been anxious or worried for no good reason. 1    I have felt scared or panicky for no good reason. 0    Things have been getting on top of me. 1    I have been so unhappy that I have had difficulty sleeping. 0    I have felt sad or miserable. 0    I have been so unhappy that I have been crying. 0    The thought of harming myself has occurred to me. 0    Edinburgh Postnatal Depression Scale Total 3            The following portions of the patient's history were reviewed and updated as appropriate: allergies, current medications, past family history, past medical history, past social history, past surgical history and problem list.  Review of Systems Pertinent items noted in HPI and remainder of comprehensive ROS otherwise negative.    Objective:  Blood pressure 119/76, pulse 95, weight 127 lb (57.6 kg), last menstrual period  01/20/2019, unknown if currently breastfeeding.  VS reviewed, nursing note reviewed,  Constitutional: well developed, well nourished, no distress HEENT: normocephalic CV: normal rate Pulm/chest wall: normal effort Abdomen: soft Neuro: alert and oriented x 3 Skin: warm, dry Psych: affect normal  Rectal exam performed as pt reports concern about feeling hemorrhoids "sticking out" sometimes, visual inspection wnl, rectal exam with gloved finger wnl       Assessment:   Normal postpartum exam. Pap smear not done at today's visit.   1. Encounter for contraceptive management, unspecified type --UPT negative - POCT urine pregnancy  2. Constipation, unspecified constipation type --Pt to take Colace, she has Rx at home --Reviewed dietary changes, including increased PO fluids and fiber  3. Postpartum care following vaginal delivery   4. General counseling and advice for contraceptive management Discussed LARCs as most effective forms of birth control.  Discussed benefits/risks of other methods.  Pt desires Depo but will consider LARC in the future.     - medroxyPROGESTERone (DEPO-PROVERA) 150 MG/ML injection; Inject 1 mL (150 mg total) into the muscle every 3 (three) months.  Dispense: 1 mL; Refill: 4 - medroxyPROGESTERone (DEPO-PROVERA) injection 150 mg  Plan:   Essential components of care per ACOG recommendations:  1.  Mood and well being: Patient with negative depression screening today. Reviewed local resources for support.  - Patient does not use tobacco.  -  hx of drug use? No    2. Infant care and feeding:  -Patient currently breastmilk feeding? Yes  If breastmilk feeding discussed return to work and pumping. If needed, patient was provided letter for work to allow for every 2-3 hr pumping breaks, and to be granted a private location to express breastmilk and refrigerated area to store breastmilk. Reviewed importance of draining breast regularly to support  lactation. -Social determinants of health (SDOH) reviewed in EPIC. No concerns   3. Sexuality, contraception and birth spacing - Patient does not want a pregnancy in the next year.   - Reviewed forms of contraception in tiered fashion. Patient desired Depo-Provera today.   - Discussed birth spacing of 18 months  4. Sleep and fatigue -Encouraged family/partner/community support of 4 hrs of uninterrupted sleep to help with mood and fatigue  5. Physical Recovery  - Discussed patients delivery - Patient had a first degree laceration, perineal healing reviewed. Patient expressed understanding - Patient has urinary incontinence? No  - Patient is safe to resume physical and sexual activity  6.  Health Maintenance - Last pap smear done n/a due to young age   Sharen Counter, CNM [facility] [employer] [employer] [facility]

## 2020-02-01 ENCOUNTER — Ambulatory Visit (INDEPENDENT_AMBULATORY_CARE_PROVIDER_SITE_OTHER)

## 2020-02-01 ENCOUNTER — Other Ambulatory Visit: Payer: Self-pay

## 2020-02-01 DIAGNOSIS — Z3009 Encounter for other general counseling and advice on contraception: Secondary | ICD-10-CM

## 2020-02-01 DIAGNOSIS — Z3042 Encounter for surveillance of injectable contraceptive: Secondary | ICD-10-CM | POA: Diagnosis not present

## 2020-02-01 MED ORDER — MEDROXYPROGESTERONE ACETATE 150 MG/ML IM SUSP: 150.0000 mg | INTRAMUSCULAR | 4 refills | Status: AC

## 2020-02-01 MED ORDER — MEDROXYPROGESTERONE ACETATE 150 MG/ML IM SUSP
150.0000 mg | Freq: Once | INTRAMUSCULAR | Status: AC
  Administered 2020-02-01: 150 mg via INTRAMUSCULAR

## 2020-02-01 NOTE — Progress Notes (Signed)
Patient presents for depo injection. Patient is within her window. Inj given in RUOQ. Pt tolerated well. Next depo due 4/18-4/28.

## 2020-02-01 NOTE — Addendum Note (Signed)
Addended by: Hamilton Capri on: 02/01/2020 10:44 AM   Modules accepted: Orders

## 2020-02-01 NOTE — Progress Notes (Signed)
Patient was assessed and managed by nursing staff during this encounter. I have reviewed the chart and agree with the documentation and plan. I have also made any necessary editorial changes.  Catalina Antigua, MD 02/01/2020 1:44 PM

## 2020-04-18 ENCOUNTER — Ambulatory Visit

## 2020-04-19 ENCOUNTER — Ambulatory Visit

## 2022-02-20 IMAGING — US US MFM OB DETAIL+14 WK
1 series · 13 of 28 positions shown · non-contrast
Comparison: none

[Series 1: us mfm ob detail+14 wk · 13 of 135 slices shown]
[im 5/135]
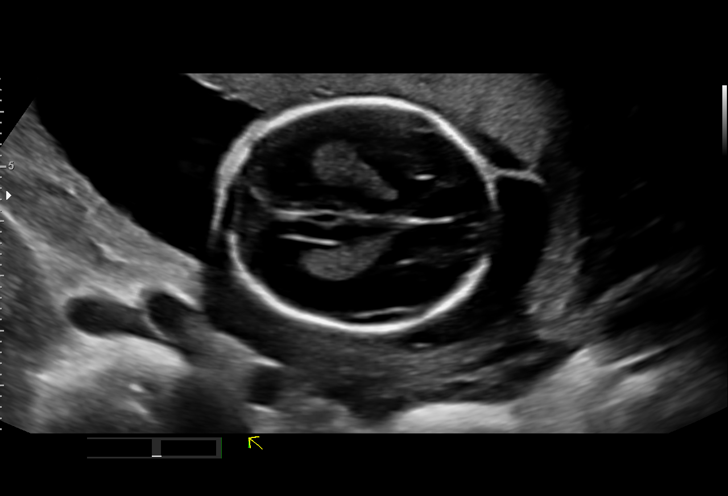
[im 15/135]
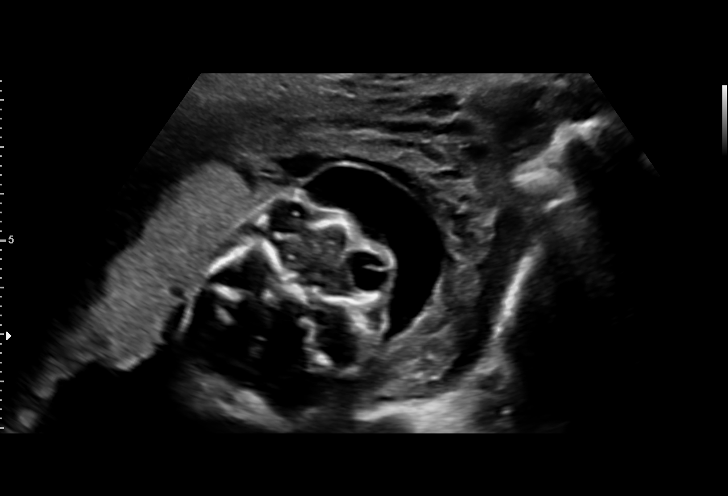
[im 25/135]
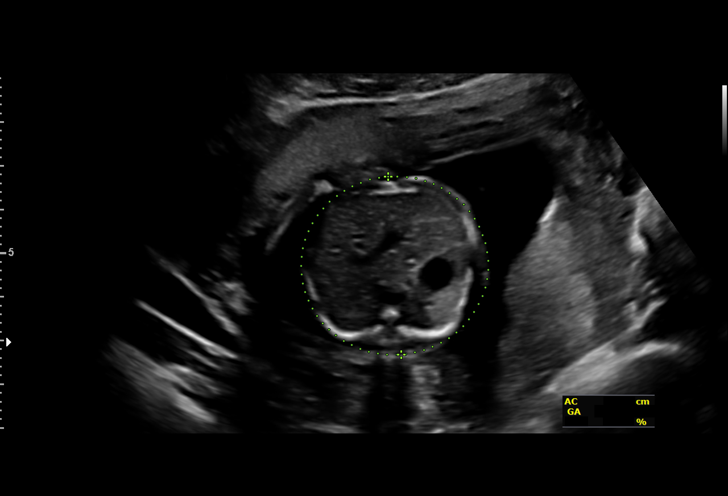
[im 35/135]
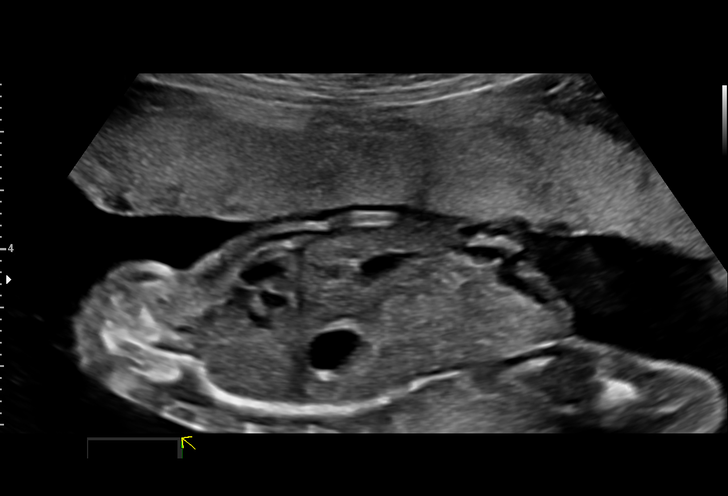
[im 45/135]
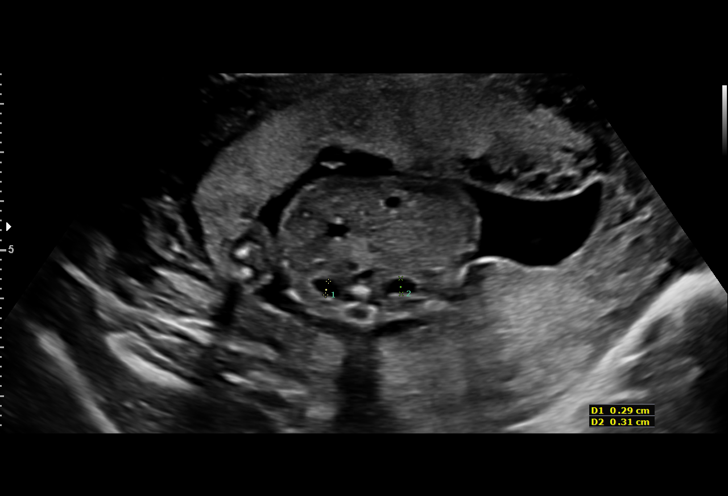
[im 55/135]
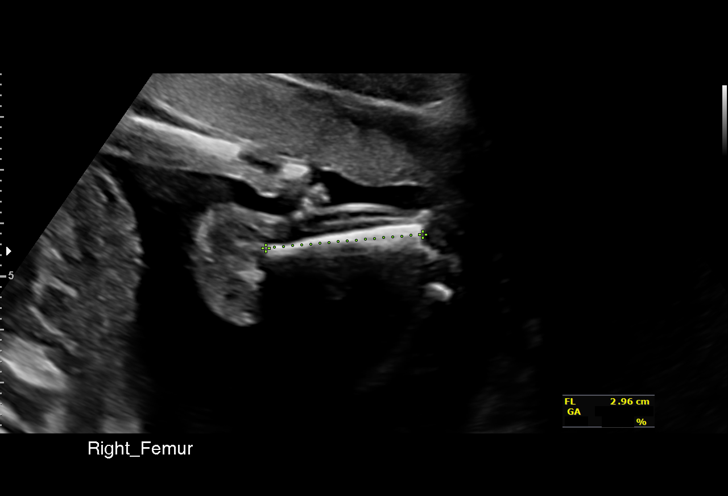
[im 70/135]
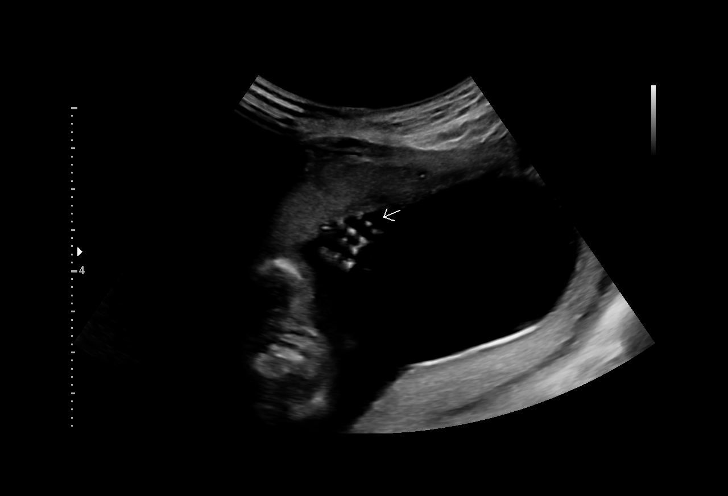
[im 80/135]
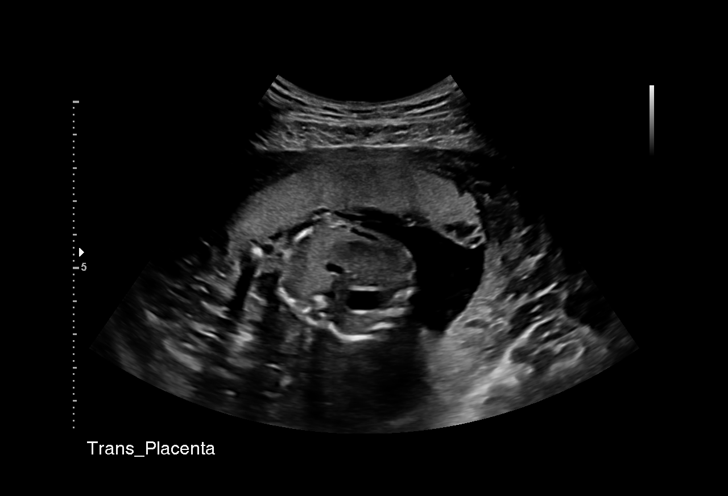
[im 90/135]
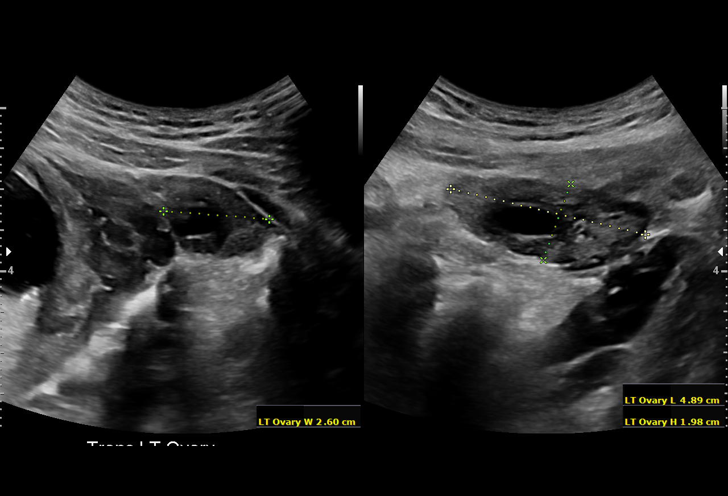
[im 100/135]
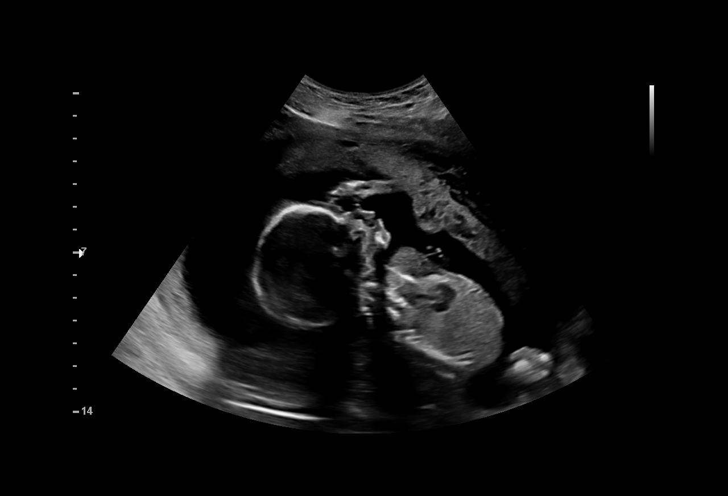
[im 110/135]
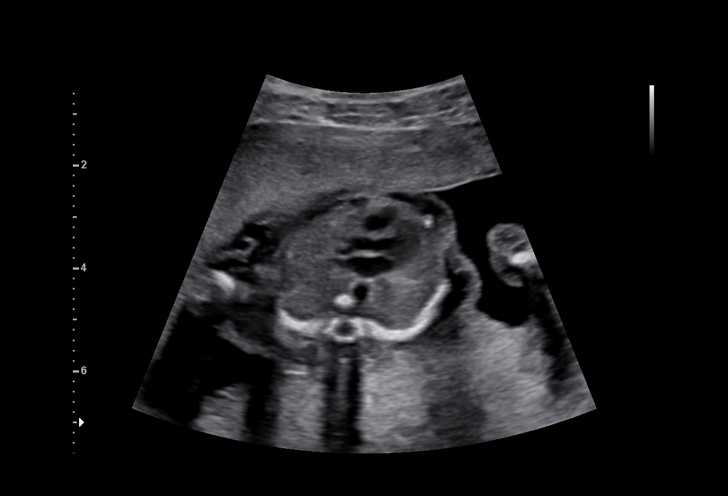
[im 120/135]
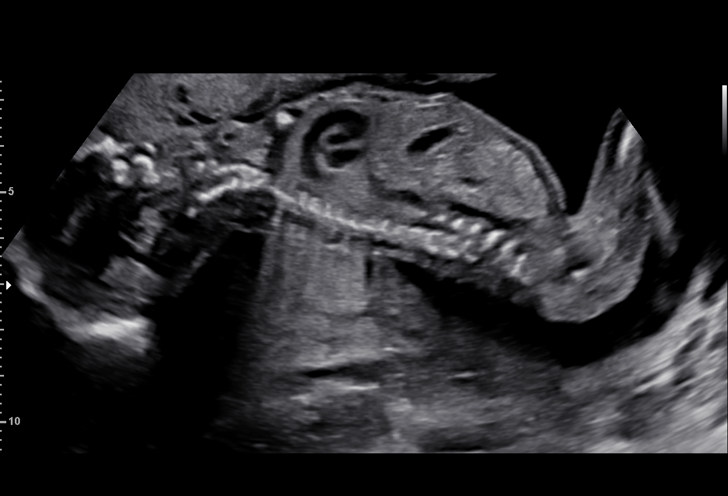
[im 130/135]
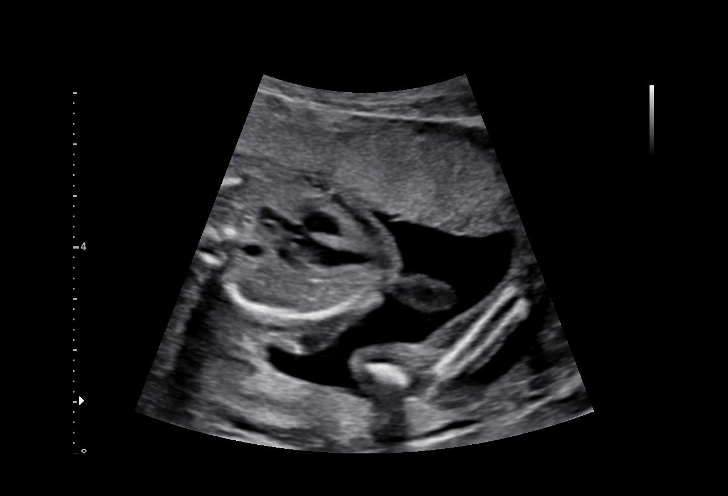

[13 of 28 positions shown; findings below may reference images not displayed]

Indications

 Encounter for antenatal screening for
 malformations (LR NIPS)
 19 weeks gestation of pregnancy
 Genetic carrier (silent carrier for Danii Aujla)YMR.L
Fetal Evaluation

 Num Of Fetuses:         1
 Fetal Heart Rate(bpm):  154
 Cardiac Activity:       Observed
 Presentation:           Breech
 Placenta:               Anterior
 P. Cord Insertion:      Visualized

 Amniotic Fluid
 AFI FV:      Within normal limits

                             Largest Pocket(cm)

Biometry

 BPD:        41  mm     G. Age:  18w 3d         26  %    CI:        70.64   %    70 - 86
                                                         FL/HC:      19.6   %    16.1 -
 HC:      155.5  mm     G. Age:  18w 3d         19  %    HC/AC:      1.18        1.09 -
 AC:      131.5  mm     G. Age:  18w 5d         34  %    FL/BPD:     74.4   %
 FL:       30.5  mm     G. Age:  19w 3d         60  %    FL/AC:      23.2   %    20 - 24
 HUM:      29.3  mm     G. Age:  19w 4d         65  %
 CER:      19.1  mm     G. Age:  18w 4d         37  %
 NFT:       3.8  mm
 LV:        6.2  mm
 CM:        4.5  mm

 Est. FW:     266  gm      0 lb 9 oz     42  %
OB History

 Gravidity:    1
Gestational Age

 LMP:           17w 6d        Date:  01/20/19                 EDD:   10/27/19
 U/S Today:     18w 5d                                        EDD:   10/21/19
 Best:          19w 0d     Det. By:  Early Ultrasound         EDD:   10/19/19
                                     (02/26/19)
Anatomy

 Cranium:               Appears normal         LVOT:                   Appears normal
 Cavum:                 Appears normal         Aortic Arch:            Appears normal
 Ventricles:            Appears normal         Ductal Arch:            Appears normal
 Choroid Plexus:        Appears normal         Diaphragm:              Appears normal
 Cerebellum:            Appears normal         Stomach:                Appears normal, left
                                                                       sided
 Posterior Fossa:       Appears normal         Abdomen:                Appears normal
 Nuchal Fold:           Appears normal         Abdominal Wall:         Appears nml (cord
                                                                       insert, abd wall)
 Face:                  Appears normal         Cord Vessels:           Appears normal (3
                        (orbits and profile)                           vessel cord)
 Lips:                  Appears normal         Kidneys:                Appear normal
 Palate:                Not well visualized    Bladder:                Appears normal
 Thoracic:              Appears normal         Spine:                  Not well visualized
 Heart:                 Appears normal         Upper Extremities:      Appears normal
                        (4CH, axis, and
                        situs)
 RVOT:                  Appears normal         Lower Extremities:      Appears normal

 Other:  Heels and 5th digit visualized. Parents do not wish to know sex of
         fetus. Nasal bone visualized. Technically difficult due to fetal position.
Cervix Uterus Adnexa

 Cervix
 Length:           3.75  cm.
 Normal appearance by transabdominal scan.

 Uterus
 No abnormality visualized.

 Right Ovary
 Not visualized.

 Left Ovary
 Size(cm)       4.9  x    2     x  2.6       Vol(ml):
 Within normal limits.
Comments

 This patient was seen for a detailed fetal anatomy scan.
 She denies any significant past medical history and denies
 any problems in her current pregnancy.
 She had a cell free DNA test earlier in her pregnancy which
 indicated a low risk for trisomy 21, 18, and 13.  The patient
 did not want the fetal gender revealed.
 She was informed that the fetal growth and amniotic fluid
 level were appropriate for her gestational age.
 There were no obvious fetal anomalies noted on today's
 ultrasound exam.  However, the views of the fetal spine were
 unable to be fully visualized today.
 The patient was informed that anomalies may be missed due
 to technical limitations. If the fetus is in a suboptimal position
 or maternal habitus is increased, visualization of the fetus in
 the maternal uterus may be impaired.
 A follow-up exam was scheduled in 4 weeks to complete the
 views of the fetal anatomy.

## 2022-05-15 IMAGING — US US MFM OB FOLLOW-UP
1 series · 13 of 28 positions shown · non-contrast
Comparison: none

[Series 1: us mfm ob follow-up · 35 acquisitions, 13 frames shown]
[im 2/35]
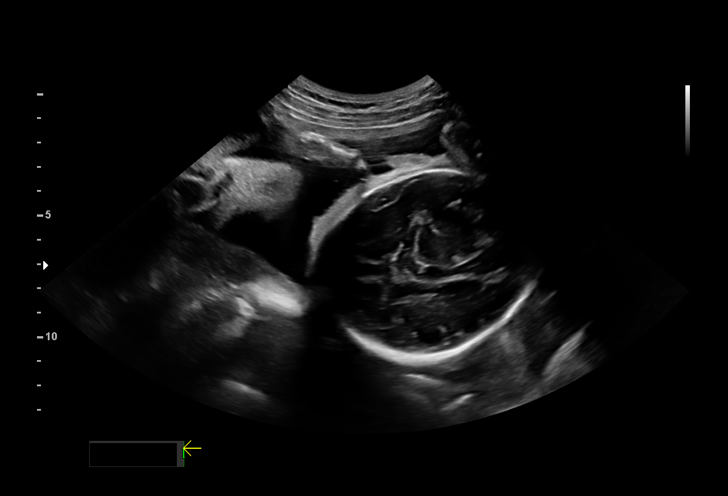
[im 4/35]
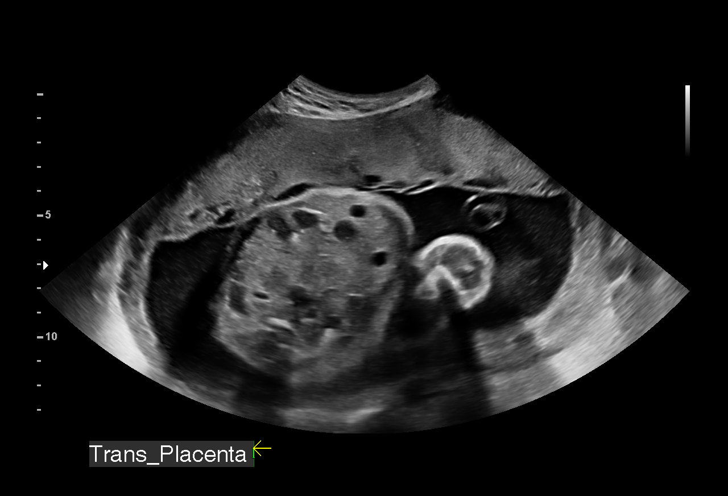
[im 7/35]
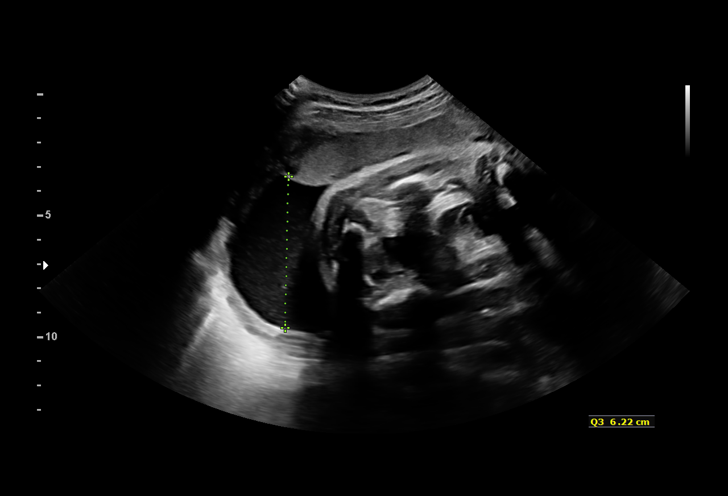
[im 9/35]
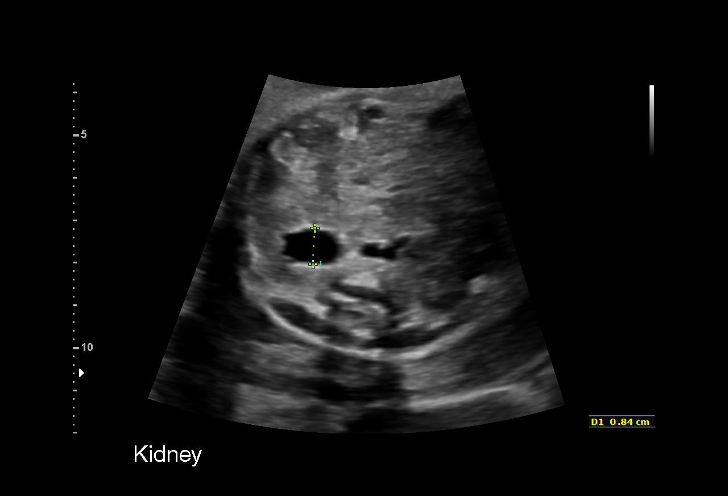
[im 12/35]
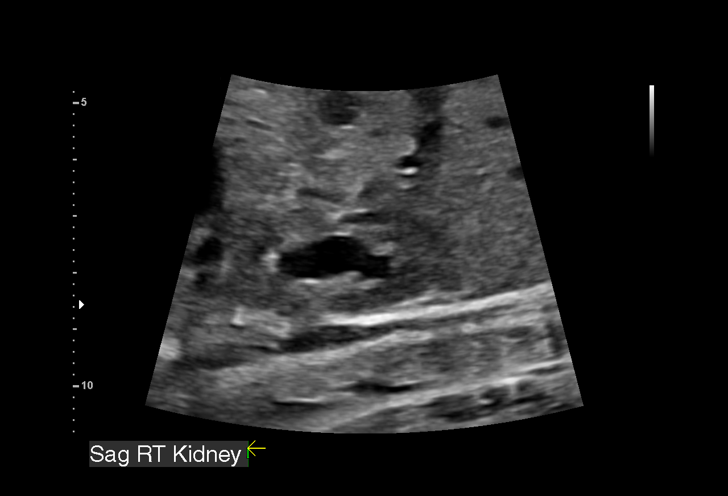
[im 14/35]
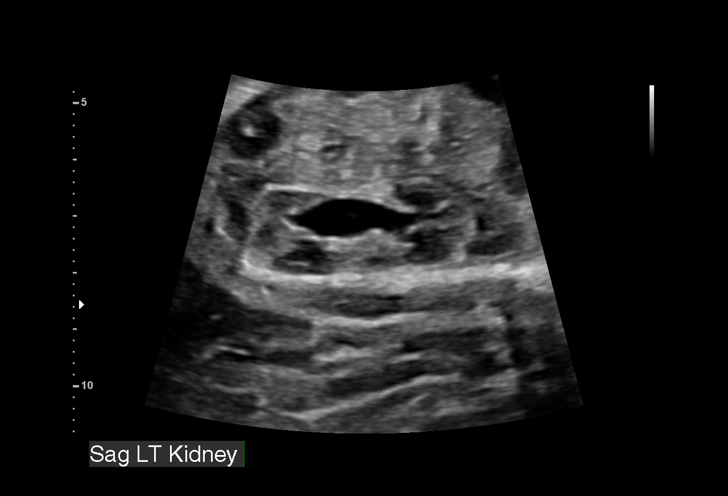
[im 18/35]
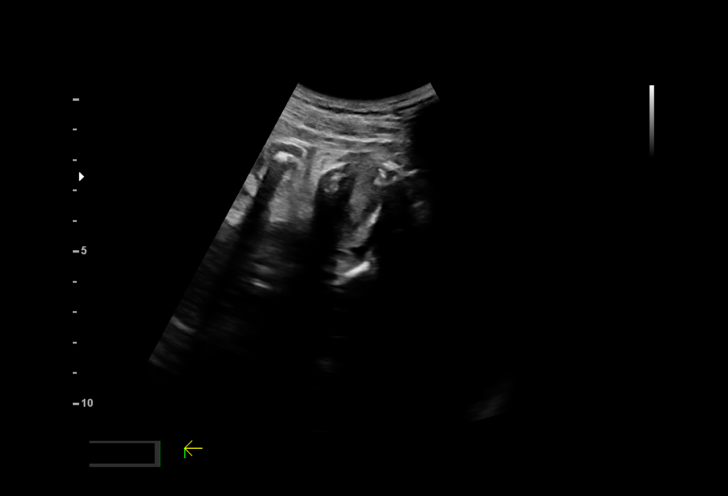
[im 21/35]
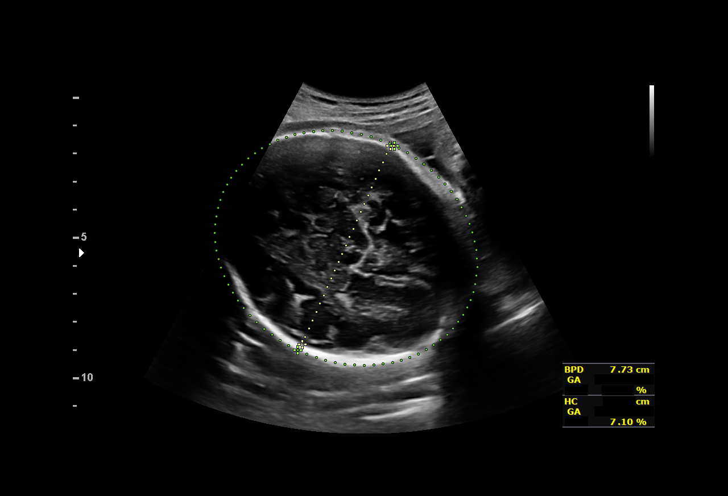
[im 23/35]
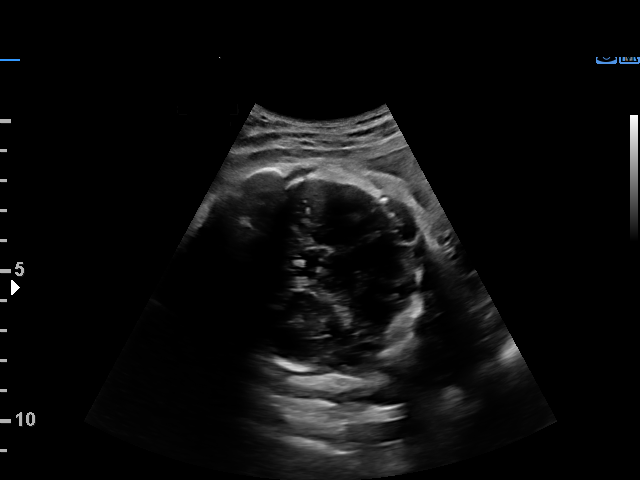
[im 26/35]
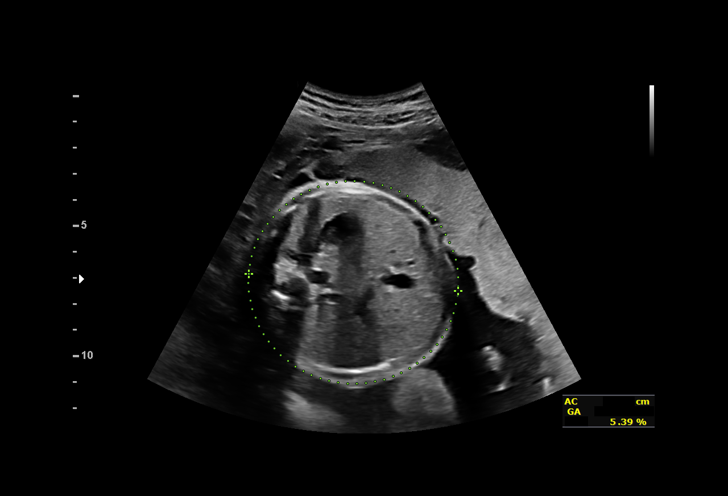
[im 28/35]
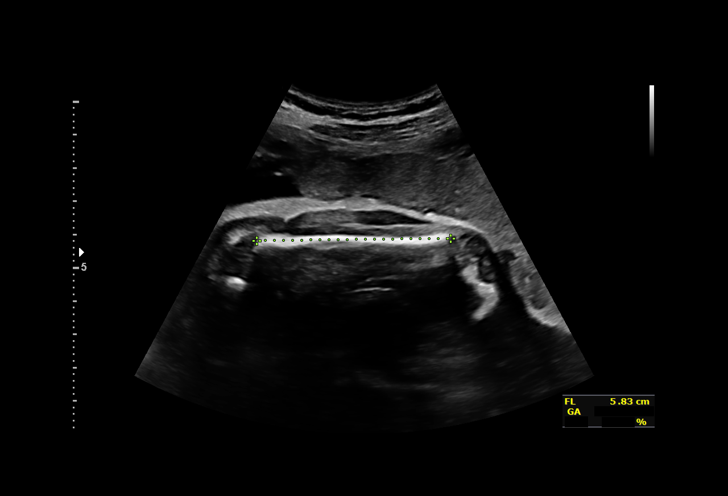
[im 31/35]
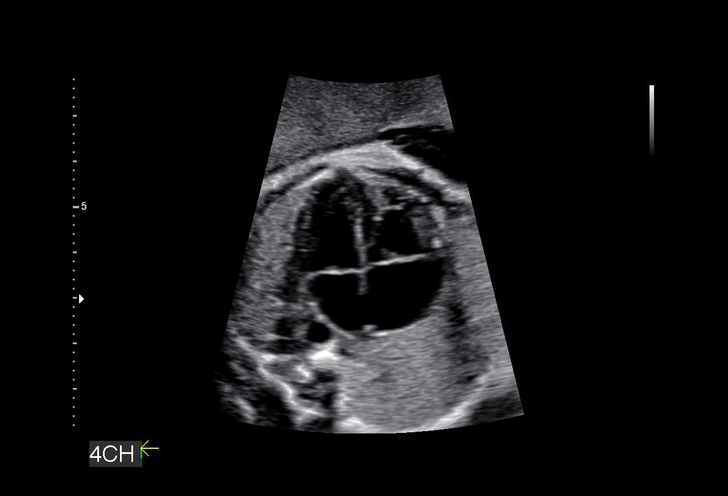
[im 33/35]
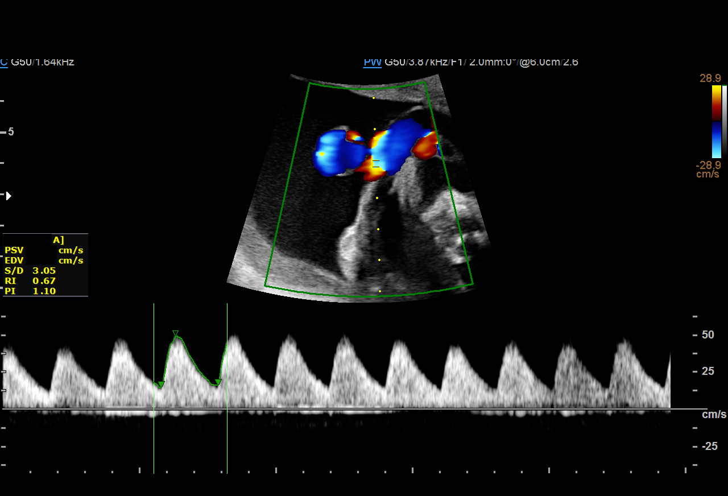

[13 of 28 positions shown; findings below may reference images not displayed]

Indications

 Pyelectasis of fetus on prenatal ultrasound
 Maternal care for known or suspected poor
 fetal growth, third trimester, not applicable or
 unspecified IUGR
 Genetic carrier (silent carrier for Rajwant Lancaster)RPT.1
 Low Risk NIPS (Negative AFP)
 Antenatal follow-up for nonvisualized fetal
 anatomy
 31 weeks gestation of pregnancy
Fetal Evaluation

 Num Of Fetuses:         1
 Cardiac Activity:       Observed
 Presentation:           Cephalic
 Placenta:               Anterior
 P. Cord Insertion:      Visualized, central

 Amniotic Fluid
 AFI FV:      Within normal limits

 AFI Sum(cm)     %Tile       Largest Pocket(cm)
 21.1            82

 RUQ(cm)       RLQ(cm)       LUQ(cm)        LLQ(cm)

Biometry
 BPD:      77.6  mm     G. Age:  31w 1d         43  %    CI:        77.12   %    70 - 86
                                                         FL/HC:      20.5   %    19.3 -
 HC:      279.8  mm     G. Age:  30w 4d         10  %    HC/AC:      1.12        0.96 -
 AC:      249.7  mm     G. Age:  29w 1d          6  %    FL/BPD:     74.0   %    71 - 87
 FL:       57.4  mm     G. Age:  30w 1d         14  %    FL/AC:      23.0   %    20 - 24
 LV:        4.6  mm

 Est. FW:    6882  gm      3 lb 3 oz      9  %
OB History

 Gravidity:    1
Gestational Age

 LMP:           29w 6d        Date:  01/20/19                 EDD:   10/27/19
 U/S Today:     30w 2d                                        EDD:   10/24/19
 Best:          31w 0d     Det. By:  Early Ultrasound         EDD:   10/19/19
                                     (02/26/19)
Anatomy

 Cranium:               Appears normal         Palate:                 Appears normal
 Cavum:                 Appears normal         Heart:                  Appears normal
                                                                       (4CH, axis, and
                                                                       situs)
 Ventricles:            Appears normal         Stomach:                Appears normal, left
                                                                       sided
 Choroid Plexus:        Appears normal         Kidneys:                Bilateral UTD (8
                                                                       mm each)
 Cerebellum:            Appears normal         Bladder:                Appears normal
 Posterior Fossa:       Appears normal

 Other:  Other anatomy previously imaged and appeared normal.
Doppler - Fetal Vessels

 Umbilical Artery
  S/D     %tile      RI    %tile
  2.72       48    0.63       50

Cervix Uterus Adnexa

 Cervix
 Normal appearance by transabdominal scan.
Impression

 Patient return for fetal growth and renal assessments.  On
 previous ultrasound bilateral mild urinary tract dilations were
 seen.  She does not have gestational diabetes.  On cell free
 fetal DNA screening, the risks of fetal aneuploidies are not
 increased.
 On ultrasound, the estimated fetal weight is at the 9th
 percentile.  Abdominal circumference measurement is at the
 6 percentile.  Amniotic fluid is normal and good fetal activity
 seen.  Umbilical artery Doppler showed normal forward
 diastolic flow.  Bilateral urinary tract dilations each measuring
 9 mm are seen.  Both kidneys, otherwise, appear normal with
 no increased echogenicities.

 I explained the findings consistent with fetal growth
 restriction.  I explained that fetal growth restriction is difficult
 to differentiate from a constitutionally small fetus.  I explained
 ultrasound protocol of antenatal testing.
 Blood pressure today at her office is 114/69 mmHg.
Recommendations

 -Continue weekly BPP and UA Doppler studies.
 -Fetal growth assessment in 3 weeks.
                 Dany, Dexter
